# Patient Record
Sex: Male | Born: 1980 | Race: White | Hispanic: No | Marital: Single | Smoking: Current every day smoker
Health system: Southern US, Community
[De-identification: ages and names within clinical notes are randomized; demographics above are authoritative.]

## PROBLEM LIST (undated history)

## (undated) DIAGNOSIS — F909 Attention-deficit hyperactivity disorder, unspecified type: Secondary | ICD-10-CM

## (undated) HISTORY — DX: Attention-deficit hyperactivity disorder, unspecified type: F90.9

---

## 2005-01-11 ENCOUNTER — Emergency Department: Payer: Self-pay | Admitting: Emergency Medicine

## 2005-09-28 ENCOUNTER — Ambulatory Visit: Payer: Self-pay | Admitting: Family Medicine

## 2005-10-17 ENCOUNTER — Emergency Department: Payer: Self-pay | Admitting: Emergency Medicine

## 2006-03-08 ENCOUNTER — Emergency Department: Payer: Self-pay | Admitting: Unknown Physician Specialty

## 2006-08-25 ENCOUNTER — Emergency Department: Payer: Self-pay | Admitting: Emergency Medicine

## 2010-09-10 ENCOUNTER — Emergency Department: Payer: Self-pay | Admitting: Emergency Medicine

## 2011-07-17 ENCOUNTER — Emergency Department: Payer: Self-pay | Admitting: *Deleted

## 2012-08-01 ENCOUNTER — Emergency Department: Payer: Self-pay | Admitting: Emergency Medicine

## 2012-08-23 ENCOUNTER — Emergency Department: Payer: Self-pay | Admitting: Emergency Medicine

## 2013-06-10 ENCOUNTER — Emergency Department: Payer: Self-pay | Admitting: Emergency Medicine

## 2018-10-25 ENCOUNTER — Ambulatory Visit: Payer: Self-pay | Admitting: Gerontology

## 2018-10-25 ENCOUNTER — Encounter: Payer: Self-pay | Admitting: Gerontology

## 2018-10-25 ENCOUNTER — Other Ambulatory Visit: Payer: Self-pay

## 2018-10-25 VITALS — BP 124/78 | HR 94 | Ht 72.0 in | Wt 171.0 lb

## 2018-10-25 DIAGNOSIS — S70261D Insect bite (nonvenomous), right hip, subsequent encounter: Secondary | ICD-10-CM

## 2018-10-25 DIAGNOSIS — Z7689 Persons encountering health services in other specified circumstances: Secondary | ICD-10-CM | POA: Insufficient documentation

## 2018-10-25 DIAGNOSIS — W57XXXA Bitten or stung by nonvenomous insect and other nonvenomous arthropods, initial encounter: Secondary | ICD-10-CM | POA: Insufficient documentation

## 2018-10-25 DIAGNOSIS — W57XXXD Bitten or stung by nonvenomous insect and other nonvenomous arthropods, subsequent encounter: Secondary | ICD-10-CM

## 2018-10-25 NOTE — Progress Notes (Signed)
Patient ID: Tyler Stanley, male   DOB: 02/22/1981, 38 y.o.   MRN: [id number]  Chief Complaint  Patient presents with  . New Patient (Initial Visit)    bug bites on back/butt, infected    HPI Tyler Stanley is a 38 y.o. male who presents to establish care and follow up for skin lesion to right upper gluteal area secondary to insect bite. He was evaluated on 10/20/18 at the [facility] for insect bite to right upper gluteal area, and he continues to take 500 mg Keflex qid and applying mupirocin antibiotic ointment to site. He reports that the bite site is improving, he denies fever, chills and itching to site. 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   He also reports that he had removed many ticks from his body, and he denies fever, headaches,fatigue and athralgia. He smokes 1/2 pack of cigarette daily and admits the desire to quit. He denies chest pain, palpitation, chills and no further concerns.   Past Medical History:  Diagnosis Date  . ADHD       No family history on file.  Social History Social History   Tobacco Use  . Smoking status: Current Every Day Smoker    Packs/day: 0.50    Types: Cigarettes  . Smokeless tobacco: Never Used  Substance Use Topics  . Alcohol use: Not Currently  . Drug use: Not Currently    No Known Allergies  Current Outpatient Medications  Medication Sig Dispense Refill  . cephALEXin (KEFLEX) 500 MG capsule Take 500 mg by mouth 4 (four) times daily.    . mupirocin ointment (BACTROBAN) 2 % Place 1 application into the nose 2 (two) times daily.     No current facility-administered medications for this visit.     Review of Systems Review of Systems  Constitutional: Negative.   HENT: Negative.   Eyes: Negative.   Respiratory: Negative.   Cardiovascular: Negative.   Gastrointestinal: Negative.   Genitourinary: Negative.   Musculoskeletal: Negative.   Skin: Negative.  Negative for rash and wound.       Insect  bite to right right upper gluteal area  Neurological: Negative.   Hematological: Negative.   Psychiatric/Behavioral: Negative.     Blood pressure 124/78, pulse 94, height 6' (1.829 m), weight 171 lb (77.6 kg), SpO2 98 %.  Physical Exam Physical Exam HENT:     Head: Normocephalic and atraumatic.  Eyes:     Extraocular Movements: Extraocular movements intact.     Pupils: Pupils are equal, round, and reactive to light.  Neck:     Musculoskeletal: Normal range of motion.  Cardiovascular:     Rate and Rhythm: Normal rate and regular rhythm.     Pulses: Normal pulses.     Heart sounds: Normal heart sounds.  Pulmonary:     Effort: Pulmonary effort is normal.     Breath sounds: Normal breath sounds.  Abdominal:     General: Abdomen is flat. Bowel sounds are normal.     Palpations: Abdomen is soft.  Musculoskeletal: Normal range of motion.  Skin:    General: Skin is warm and dry.     Findings: Erythema and lesion present.       Neurological:     General: No focal deficit present.     Mental Status: He is alert and oriented to person, place, and time. Mental status is at baseline.  Psychiatric:  Mood and Affect: Mood normal.        Behavior: Behavior normal.        Thought Content: Thought content normal.        Judgment: Judgment normal.     Data Reviewed Routine labs will be collected.  Assessment and Plan 1. Encounter to establish care - Routine labs will be collected. - CBC w/Diff; Future - Comp Met (CMET); Future - Urinalysis; Future - Lipid panel; Future - TSH; Future  2. Insect bite of right hip, subsequent encounter - He was encouraged to finish antibiotics and to notify clinic for worsening symptoms. - CBC w/Diff; Future  3. Tick bite, subsequent encounter - He was advised to notify clinic for any tick bites. - Lyme Disease, Western Blot; Future   Follow up in 3 months 01/25/2019 or if symptoms worsens.  Tyler Stanley 10/25/2018, 9:53 PM

## 2018-11-16 ENCOUNTER — Other Ambulatory Visit: Payer: Self-pay | Admitting: Gerontology

## 2018-11-16 DIAGNOSIS — K0500 Acute gingivitis, plaque induced: Secondary | ICD-10-CM

## 2018-11-16 MED ORDER — CHLORHEXIDINE GLUCONATE 0.12 % MT SOLN: 15.0000 mL | Freq: Two times a day (BID) | OROMUCOSAL | 0 refills | Status: DC

## 2018-11-16 MED ORDER — METRONIDAZOLE 250 MG PO TABS: 250.0000 mg | ORAL_TABLET | Freq: Three times a day (TID) | ORAL | 0 refills | Status: DC

## 2018-11-23 ENCOUNTER — Other Ambulatory Visit: Payer: Self-pay

## 2018-11-23 ENCOUNTER — Ambulatory Visit: Payer: Self-pay | Admitting: Pharmacy Technician

## 2018-11-23 DIAGNOSIS — Z79899 Other long term (current) drug therapy: Secondary | ICD-10-CM

## 2018-11-23 NOTE — Progress Notes (Signed)
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  Also, provide 2019 tax return.  [facility]

## 2019-01-18 ENCOUNTER — Other Ambulatory Visit: Payer: Self-pay

## 2019-01-24 ENCOUNTER — Encounter: Payer: Self-pay | Admitting: Gerontology

## 2019-01-24 ENCOUNTER — Ambulatory Visit: Payer: Self-pay | Admitting: Gerontology

## 2019-01-24 ENCOUNTER — Other Ambulatory Visit: Payer: Self-pay

## 2019-01-24 DIAGNOSIS — I10 Essential (primary) hypertension: Secondary | ICD-10-CM | POA: Insufficient documentation

## 2019-01-24 DIAGNOSIS — F17200 Nicotine dependence, unspecified, uncomplicated: Secondary | ICD-10-CM | POA: Insufficient documentation

## 2019-01-24 DIAGNOSIS — IMO0001 Reserved for inherently not codable concepts without codable children: Secondary | ICD-10-CM | POA: Insufficient documentation

## 2019-01-24 NOTE — Progress Notes (Signed)
Established Patient Office Visit  Subjective:  Patient ID: Tyler Stanley, male    DOB: [birth year]  Age: 38 y.o. MRN: [id number]  CC: No chief complaint on file.   HPI Tyler Stanley presents for 3 months follow up. He reports that the skin lesionsto to his right upper gluteal area secondary to insect bite has resolved. He states that he forgot to follow up with his lab draw. His blood pressure during visit was elevated, he denies chest pain, palpitation, light headedness, and headache. He continues to smoke 1 pack of cigarette daily and admits the desire to quit. He states that he will make some lifestyle modifications, he reports that he's doing well and denies no further concerns.  Past Medical History:  Diagnosis Date  . ADHD       No family history on file.  Social History   Socioeconomic History  . Marital status: Single    Spouse name: Not on file  . Number of children: Not on file  . Years of education: Not on file  . Highest education level: Not on file  Occupational History  . Not on file  Social Needs  . Financial resource strain: Not on file  . Food insecurity    Worry: Not on file    Inability: Not on file  . Transportation needs    Medical: Not on file    Non-medical: Not on file  Tobacco Use  . Smoking status: Current Every Day Smoker    Packs/day: 0.50    Types: Cigarettes  . Smokeless tobacco: Never Used  Substance and Sexual Activity  . Alcohol use: Not Currently  . Drug use: Not Currently  . Sexual activity: Not on file  Lifestyle  . Physical activity    Days per week: Not on file    Minutes per session: Not on file  . Stress: Not on file  Relationships  . Social Herbalist on phone: Not on file    Gets together: Not on file    Attends religious service: Not on file    Active member of club or organization: Not on file    Attends meetings of clubs or organizations: Not on file    Relationship status: Not on file  . Intimate  partner violence    Fear of current or ex partner: Not on file    Emotionally abused: Not on file    Physically abused: Not on file    Forced sexual activity: Not on file  Other Topics Concern  . Not on file  Social History Narrative  . Not on file    Outpatient Medications Prior to Visit  Medication Sig Dispense Refill  . cephALEXin (KEFLEX) 500 MG capsule Take 500 mg by mouth 4 (four) times daily.    . chlorhexidine (PERIDEX) 0.12 % solution Use as directed 15 mLs in the mouth or throat 2 (two) times daily. (Patient not taking: Reported on 01/24/2019) 120 mL 0  . metroNIDAZOLE (FLAGYL) 250 MG tablet Take 1 tablet (250 mg total) by mouth 3 (three) times daily. (Patient not taking: Reported on 01/24/2019) 21 tablet 0  . mupirocin ointment (BACTROBAN) 2 % Place 1 application into the nose 2 (two) times daily.     No facility-administered medications prior to visit.     No Known Allergies  ROS Review of Systems  Constitutional: Negative.   HENT: Negative.   Eyes: Negative.   Respiratory: Negative.   Cardiovascular: Negative.  Genitourinary: Negative.   Skin: Negative.   Neurological: Negative.   Psychiatric/Behavioral: Negative.       Objective:    Physical Exam  Constitutional: He is oriented to person, place, and time. He appears well-developed and well-nourished.  HENT:  Head: Normocephalic.  Eyes: Pupils are equal, round, and reactive to light. EOM are normal.  Cardiovascular: Normal rate and regular rhythm.  Pulmonary/Chest: Effort normal.  Abdominal: Soft. Bowel sounds are normal.  Neurological: He is alert and oriented to person, place, and time.  Skin: Skin is warm and dry.  Psychiatric: He has a normal mood and affect. His behavior is normal. Judgment and thought content normal.    BP (!) 141/99 (BP Location: Left Arm, Patient Position: Sitting, Cuff Size: Normal)   Pulse 91   Ht 6' (1.829 m)   Wt 159 lb (72.1 kg)   SpO2 100%   BMI 21.56 kg/m  Wt  Readings from Last 3 Encounters:  01/24/19 159 lb (72.1 kg)  10/25/18 171 lb (77.6 kg)     Health Maintenance Due  Topic Date Due  . HIV Screening  03/19/1995  . TETANUS/TDAP  03/19/1999    There are no preventive care reminders to display for this patient.  No results found for: TSH No results found for: WBC, HGB, HCT, MCV, PLT No results found for: NA, K, CHLORIDE, CO2, GLUCOSE, BUN, CREATININE, BILITOT, ALKPHOS, AST, ALT, PROT, ALBUMIN, CALCIUM, ANIONGAP, EGFR, GFR No results found for: CHOL No results found for: HDL No results found for: LDLCALC No results found for: TRIG No results found for: CHOLHDL No results found for: HGBA1C    Assessment & Plan:     1. Smoking - He was strongly advised on smoking cessation, he was provided with [facility] Quit line.  2. Essential hypertension - His blood pressure was 141/99, his goal BP is < 140/90. He was advised to check his blood pressure at home, record and bring back to clinic. - He was advised to continue on Low salt DASH diet -Exercise regularly as tolerated - He will follow up with his Lab draw.    Follow-up: Return in about 30 days (around 02/23/2019), or if symptoms worsen or fail to improve.    Breannah Kratt Jerold Coombe, NP

## 2019-01-24 NOTE — Patient Instructions (Signed)

## 2019-02-01 ENCOUNTER — Other Ambulatory Visit: Payer: Self-pay

## 2019-02-01 DIAGNOSIS — W57XXXD Bitten or stung by nonvenomous insect and other nonvenomous arthropods, subsequent encounter: Secondary | ICD-10-CM

## 2019-02-01 DIAGNOSIS — S70261D Insect bite (nonvenomous), right hip, subsequent encounter: Secondary | ICD-10-CM

## 2019-02-01 DIAGNOSIS — Z7689 Persons encountering health services in other specified circumstances: Secondary | ICD-10-CM

## 2019-02-09 LAB — COMPREHENSIVE METABOLIC PANEL
ALT: 10 IU/L (ref 0–44)
AST: 15 IU/L (ref 0–40)
Albumin/Globulin Ratio: 1.8 (ref 1.2–2.2)
Albumin: 4.4 g/dL (ref 4.0–5.0)
Alkaline Phosphatase: 74 IU/L (ref 39–117)
BUN/Creatinine Ratio: 15 (ref 9–20)
BUN: 13 mg/dL (ref 6–20)
Bilirubin Total: 0.4 mg/dL (ref 0.0–1.2)
CO2: 27 mmol/L (ref 20–29)
Calcium: 9.5 mg/dL (ref 8.7–10.2)
Chloride: 101 mmol/L (ref 96–106)
Creatinine, Ser: 0.85 mg/dL (ref 0.76–1.27)
GFR calc Af Amer: 128 mL/min/{1.73_m2} (ref >59)
GFR calc non Af Amer: 111 mL/min/{1.73_m2} (ref >59)
Globulin, Total: 2.5 g/dL (ref 1.5–4.5)
Glucose: 148 mg/dL — ABNORMAL HIGH (ref 65–99)
Potassium: 4.8 mmol/L (ref 3.5–5.2)
Sodium: 140 mmol/L (ref 134–144)
Total Protein: 6.9 g/dL (ref 6.0–8.5)

## 2019-02-09 LAB — CBC WITH DIFFERENTIAL/PLATELET
Basophils Absolute: 0 10*3/uL (ref 0.0–0.2)
Basos: 1 %
EOS (ABSOLUTE): 0.1 10*3/uL (ref 0.0–0.4)
Eos: 2 %
Hematocrit: 46.2 % (ref 37.5–51.0)
Hemoglobin: 15.6 g/dL (ref 13.0–17.7)
Immature Grans (Abs): 0 10*3/uL (ref 0.0–0.1)
Immature Granulocytes: 0 %
Lymphocytes Absolute: 2.1 10*3/uL (ref 0.7–3.1)
Lymphs: 40 %
MCH: 30.6 pg (ref 26.6–33.0)
MCHC: 33.8 g/dL (ref 31.5–35.7)
MCV: 91 fL (ref 79–97)
Monocytes Absolute: 0.4 10*3/uL (ref 0.1–0.9)
Monocytes: 7 %
Neutrophils Absolute: 2.7 10*3/uL (ref 1.4–7.0)
Neutrophils: 50 %
Platelets: 208 10*3/uL (ref 150–450)
RBC: 5.1 x10E6/uL (ref 4.14–5.80)
RDW: 12.6 % (ref 11.6–15.4)
WBC: 5.4 10*3/uL (ref 3.4–10.8)

## 2019-02-09 LAB — LYME DISEASE, WESTERN BLOT
IgG P18 Ab.: ABSENT
IgG P28 Ab.: ABSENT
IgG P30 Ab.: ABSENT
IgG P39 Ab.: ABSENT
IgG P41 Ab.: ABSENT
IgG P45 Ab.: ABSENT
IgG P58 Ab.: ABSENT
IgG P66 Ab.: ABSENT
IgG P93 Ab.: ABSENT
IgM P39 Ab.: ABSENT
IgM P41 Ab.: ABSENT
Lyme IgG Wb: NEGATIVE
Lyme IgM Wb: NEGATIVE

## 2019-02-09 LAB — LIPID PANEL
Chol/HDL Ratio: 2.9 ratio (ref 0.0–5.0)
Cholesterol, Total: 199 mg/dL (ref 100–199)
HDL: 68 mg/dL (ref >39)
LDL Chol Calc (NIH): 117 mg/dL — ABNORMAL HIGH (ref 0–99)
Triglycerides: 78 mg/dL (ref 0–149)
VLDL Cholesterol Cal: 14 mg/dL (ref 5–40)

## 2019-02-09 LAB — TSH: TSH: 0.78 u[IU]/mL (ref 0.450–4.500)

## 2019-02-14 ENCOUNTER — Ambulatory Visit: Payer: Self-pay | Admitting: Gerontology

## 2019-03-02 ENCOUNTER — Telehealth: Payer: Self-pay | Admitting: Pharmacy Technician

## 2019-03-02 NOTE — Telephone Encounter (Signed)
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  Patient failed to provide verification of income from farming job and 2019 tax return.  No additional medication assistance will be provided by [facility] without the required proof of income documentation.  Patient notified.  [facility]

## 2019-09-13 ENCOUNTER — Ambulatory Visit: Payer: Self-pay

## 2020-09-18 ENCOUNTER — Encounter: Payer: Self-pay | Admitting: Emergency Medicine

## 2020-09-18 ENCOUNTER — Other Ambulatory Visit: Payer: Self-pay

## 2020-09-18 ENCOUNTER — Emergency Department
Admission: EM | Admit: 2020-09-18 | Discharge: 2020-09-18 | Disposition: A | Discharge status: Home / Self Care | Payer: 59 | Attending: Emergency Medicine | Admitting: Emergency Medicine

## 2020-09-18 ENCOUNTER — Emergency Department: Payer: 59

## 2020-09-18 DIAGNOSIS — G44221 Chronic tension-type headache, intractable: Secondary | ICD-10-CM | POA: Insufficient documentation

## 2020-09-18 DIAGNOSIS — R519 Headache, unspecified: Secondary | ICD-10-CM

## 2020-09-18 DIAGNOSIS — F17210 Nicotine dependence, cigarettes, uncomplicated: Secondary | ICD-10-CM | POA: Diagnosis not present

## 2020-09-18 DIAGNOSIS — K047 Periapical abscess without sinus: Secondary | ICD-10-CM | POA: Diagnosis not present

## 2020-09-18 LAB — COMPREHENSIVE METABOLIC PANEL
ALT: 20 U/L (ref 0–44)
AST: 23 U/L (ref 15–41)
Albumin: 4.2 g/dL (ref 3.5–5.0)
Alkaline Phosphatase: 66 U/L (ref 38–126)
Anion gap: 10 (ref 5–15)
BUN: 11 mg/dL (ref 6–20)
CO2: 26 mmol/L (ref 22–32)
Calcium: 9.6 mg/dL (ref 8.9–10.3)
Chloride: 100 mmol/L (ref 98–111)
Creatinine, Ser: 0.84 mg/dL (ref 0.61–1.24)
GFR, Estimated: >60 mL/min (ref >60)
Glucose, Bld: 91 mg/dL (ref 70–99)
Potassium: 4 mmol/L (ref 3.5–5.1)
Sodium: 136 mmol/L (ref 135–145)
Total Bilirubin: 0.6 mg/dL (ref 0.3–1.2)
Total Protein: 7.1 g/dL (ref 6.5–8.1)

## 2020-09-18 LAB — CBC
HCT: 47.7 % (ref 39.0–52.0)
Hemoglobin: 16.1 g/dL (ref 13.0–17.0)
MCH: 31 pg (ref 26.0–34.0)
MCHC: 33.8 g/dL (ref 30.0–36.0)
MCV: 91.9 fL (ref 80.0–100.0)
Platelets: 184 10*3/uL (ref 150–400)
RBC: 5.19 MIL/uL (ref 4.22–5.81)
RDW: 12.2 % (ref 11.5–15.5)
WBC: 8.6 10*3/uL (ref 4.0–10.5)
nRBC: 0 % (ref 0.0–0.2)

## 2020-09-18 LAB — SEDIMENTATION RATE: Sed Rate: 2 mm/hr (ref 0–15)

## 2020-09-18 MED ORDER — IBUPROFEN 800 MG PO TABS
800.0000 mg | ORAL_TABLET | Freq: Three times a day (TID) | ORAL | 0 refills | Status: DC | PRN
  Filled 2020-09-18: qty 20, 7d supply, fill #0

## 2020-09-18 MED ORDER — AMOXICILLIN-POT CLAVULANATE 875-125 MG PO TABS: 1.0000 | ORAL_TABLET | Freq: Two times a day (BID) | ORAL | 0 refills | Status: AC

## 2020-09-18 MED ORDER — IBUPROFEN 800 MG PO TABS: 800.0000 mg | ORAL_TABLET | Freq: Three times a day (TID) | ORAL | 0 refills | Status: DC | PRN

## 2020-09-18 MED ORDER — OXYCODONE-ACETAMINOPHEN 5-325 MG PO TABS
1.0000 | ORAL_TABLET | Freq: Once | ORAL | Status: AC
  Administered 2020-09-18: 1 via ORAL
  Filled 2020-09-18: qty 1

## 2020-09-18 MED ORDER — IBUPROFEN 800 MG PO TABS
800.0000 mg | ORAL_TABLET | Freq: Once | ORAL | Status: AC
  Administered 2020-09-18: 800 mg via ORAL
  Filled 2020-09-18: qty 1

## 2020-09-18 MED ORDER — AMOXICILLIN-POT CLAVULANATE 875-125 MG PO TABS
1.0000 | ORAL_TABLET | Freq: Two times a day (BID) | ORAL | 0 refills | Status: DC
  Filled 2020-09-18: qty 14, 7d supply, fill #0

## 2020-09-18 NOTE — ED Provider Notes (Signed)
[facility] Emergency Department Provider Note  ____________________________________________   Event Date/Time   First MD Initiated Contact with Patient 09/18/20 1035     (approximate)  I have reviewed the triage vital signs and the nursing notes.   HISTORY  Chief Complaint Headache and Blurred Vision    HPI Tyler Stanley is a 40 y.o. male here with left sided headache and tooth pain.  The patient states that for the last 3 to 4 days, he has had an aching, throbbing, left upper tooth ache and pain along the left temple and behind his eye.  It is an aching, throbbing pain that is intermittently sharp and stabbing.  He states that it is worse when he chews food and occasionally grinds his teeth on the left.  It shoots up from his tooth to the area of the temple.  He states that when the pain is severe he has felt like his vision may have been blurry but this is not persisted.  No floaters.  No history of prior migraines.  No history of eye issues.  Of note, he does state that his tooth fractured several days ago prior to the onset of pain.  No fevers or chills.  No neck pain or stiffness.  No photophobia or phonophobia.        Past Medical History:  Diagnosis Date  . ADHD     Patient Active Problem List   Diagnosis Date Noted  . Smoking 01/24/2019  . Essential hypertension 01/24/2019  . Encounter to establish care 10/25/2018  . Insect bite 10/25/2018  . Tick bites 10/25/2018    History reviewed. No pertinent surgical history.  Prior to Admission medications   Medication Sig Start Date End Date Taking? Authorizing Provider  amoxicillin-clavulanate (AUGMENTIN) 875-125 MG tablet Take 1 tablet by mouth 2 (two) times daily for 7 days. 09/18/20 09/25/20 Yes Shaune Pollack, MD  ibuprofen (ADVIL) 600 MG tablet Take 1 tablet (600 mg total) by mouth every 8 (eight) hours as needed for moderate pain. 09/18/20  Yes Shaune Pollack, MD    Allergies Patient has  no known allergies.  History reviewed. No pertinent family history.  Social History Social History   Tobacco Use  . Smoking status: Current Every Day Smoker    Packs/day: 0.50    Types: Cigarettes  . Smokeless tobacco: Never Used  Vaping Use  . Vaping Use: Never used  Substance Use Topics  . Alcohol use: Not Currently  . Drug use: Not Currently    Review of Systems  Review of Systems  Constitutional: Positive for fatigue. Negative for chills and fever.  HENT: Positive for dental problem and sinus pressure. Negative for sore throat.   Respiratory: Negative for shortness of breath.   Cardiovascular: Negative for chest pain.  Gastrointestinal: Negative for abdominal pain.  Genitourinary: Negative for flank pain.  Musculoskeletal: Negative for neck pain.  Skin: Negative for rash and wound.  Allergic/Immunologic: Negative for immunocompromised state.  Neurological: Positive for headaches. Negative for weakness and numbness.  Hematological: Does not bruise/bleed easily.  All other systems reviewed and are negative.    ____________________________________________  PHYSICAL EXAM:      VITAL SIGNS: ED Triage Vitals  Enc Vitals Group     BP 09/18/20 0942 (!) 144/110     Pulse Rate 09/18/20 0942 (!) 113     Resp 09/18/20 0942 20     Temp 09/18/20 0942 97.6 F (36.4 C)     Temp src --  SpO2 09/18/20 0942 100 %     Weight 09/18/20 0941 158 lb (71.7 kg)     Height 09/18/20 0941 6' (1.829 m)     Head Circumference --      Peak Flow --      Pain Score 09/18/20 0941 9     Pain Loc --      Pain Edu? --      Excl. in GC? --      Physical Exam Vitals and nursing note reviewed.  Constitutional:      General: He is not in acute distress.    Appearance: He is well-developed.  HENT:     Head: Normocephalic and atraumatic.     Comments: Markedly poor dentition, with open cavity and fractured tooth to the left upper premolar.  No appreciable gingival or dental abscess.   No facial swelling or erythema.  Extraocular movements intact without pain.  No proptosis. Eyes:     Conjunctiva/sclera: Conjunctivae normal.     Comments: No conjunctival injection.  Pupils are equal and symmetric.  Extraocular movements full and painless.  No proptosis.  Cardiovascular:     Rate and Rhythm: Normal rate and regular rhythm.     Heart sounds: Normal heart sounds. No murmur heard. No friction rub.  Pulmonary:     Effort: Pulmonary effort is normal. No respiratory distress.     Breath sounds: Normal breath sounds. No wheezing or rales.  Abdominal:     General: There is no distension.     Palpations: Abdomen is soft.     Tenderness: There is no abdominal tenderness.  Musculoskeletal:     Cervical back: Neck supple.  Skin:    General: Skin is warm.     Capillary Refill: Capillary refill takes less than 2 seconds.  Neurological:     Mental Status: He is alert and oriented to person, place, and time.     Motor: No abnormal muscle tone.       ____________________________________________   LABS (all labs ordered are listed, but only abnormal results are displayed)  Labs Reviewed  SEDIMENTATION RATE  CBC  COMPREHENSIVE METABOLIC PANEL    ____________________________________________  EKG:  ________________________________________  RADIOLOGY All imaging, including plain films, CT scans, and ultrasounds, independently reviewed by me, and interpretations confirmed via formal radiology reads.  ED MD interpretation:   CT head: No acute abnormality  Official radiology report(s): CT Head Wo Contrast  Result Date: 09/18/2020 CLINICAL DATA:  Headache. EXAM: CT HEAD WITHOUT CONTRAST TECHNIQUE: Contiguous axial images were obtained from the base of the skull through the vertex without intravenous contrast. COMPARISON:  July 17, 2011 FINDINGS: Brain: No evidence of acute infarction, hemorrhage, hydrocephalus, extra-axial collection or mass lesion/mass effect. Vascular: No  hyperdense vessel or unexpected calcification. Skull: Normal. Negative for fracture or focal lesion. Sinuses/Orbits: No acute finding. Other: None. IMPRESSION: No acute intracranial abnormality. Electronically Signed   By: Ted Mcalpine M.D.   On: 09/18/2020 10:27    ____________________________________________  PROCEDURES   Procedure(s) performed (including Critical Care):  Procedures  ____________________________________________  INITIAL IMPRESSION / MDM / ASSESSMENT AND PLAN / ED COURSE  As part of my medical decision making, I reviewed the following data within the electronic MEDICAL RECORD NUMBER Nursing notes reviewed and incorporated, Old chart reviewed, Notes from prior ED visits, and [place] Controlled Substance Database       *MCLAIN FREER was evaluated in Emergency Department on 09/18/2020 for the symptoms described in the history of present  illness. He was evaluated in the context of the global COVID-19 pandemic, which necessitated consideration that the patient might be at risk for infection with the SARS-CoV-2 virus that causes COVID-19. Institutional protocols and algorithms that pertain to the evaluation of patients at risk for COVID-19 are in a state of rapid change based on information released by regulatory bodies including the CDC and federal and state organizations. These policies and algorithms were followed during the patient's care in the ED.  Some ED evaluations and interventions may be delayed as a result of limited staffing during the pandemic.*     Medical Decision Making: 40 year old male here with left upper tooth pain and headache.  Clinically, exam is consistent with suspected dental pain with referred pain to the temple area.  He has a fractured, infected tooth without apparent abscess.  No fever, chills, photophobia, phonophobia, or signs of systemic illness or evidence to suggest meningitis or encephalitis.  His vision is intact and he has no persistent vision  changes, no conjunctival injection, no proptosis, no pain with eye movements, or other evidence to suggest preseptal or preseptal cellulitis, glaucoma, or other intraocular abnormality.  CT head obtained in triage, reviewed by me and is unremarkable.  CBC and CMP unremarkable without leukocytosis or electrolyte abnormality.  Sed rate normal, pain and exam is not concerning for temporal arteritis.  Patient will be placed on empiric antibiotics, anti-inflammatories, and discharged home.  ____________________________________________  FINAL CLINICAL IMPRESSION(S) / ED DIAGNOSES  Final diagnoses:  Dental infection  Left temporal headache     MEDICATIONS GIVEN DURING THIS VISIT:  Medications  ibuprofen (ADVIL) tablet 800 mg (has no administration in time range)  oxyCODONE-acetaminophen (PERCOCET/ROXICET) 5-325 MG per tablet 1 tablet (has no administration in time range)     ED Discharge Orders         Ordered    ibuprofen (ADVIL) 600 MG tablet  Every 8 hours PRN        09/18/20 1054    amoxicillin-clavulanate (AUGMENTIN) 875-125 MG tablet  2 times daily        09/18/20 1054           Note:  This document was prepared using Dragon voice recognition software and may include unintentional dictation errors.   Shaune Pollack, MD 09/18/20 1054

## 2020-09-18 NOTE — ED Notes (Signed)
Pt reports pressure behind eye similar to a previous sinus infection. Denies any blurred vision at this time, denies n/v. Reports pressure headache on right side of head and behind the eye

## 2020-09-18 NOTE — ED Triage Notes (Signed)
Pt to ED via POV from [facility] with c/o HA x 3 days behind R eye, pt states constant dull pain behind R eye, with intermittent sharp pain, pt also states pain to R temple. Pt states blurry vision happens with sharp pains. PT A&O x4, ambulatory without difficulty.

## 2021-08-20 DIAGNOSIS — M79644 Pain in right finger(s): Secondary | ICD-10-CM | POA: Diagnosis not present

## 2021-08-20 DIAGNOSIS — M7989 Other specified soft tissue disorders: Secondary | ICD-10-CM | POA: Diagnosis not present

## 2022-02-25 IMAGING — CT CT HEAD W/O CM
3 series · 16 of 47 positions shown, 19 images · non-contrast
Comparison: July 17, 2011

CLINICAL DATA: Headache.

EXAM:
CT HEAD WITHOUT CONTRAST
TECHNIQUE: Contiguous axial images were obtained from the base of the skull
through the vertex without intravenous contrast.

[Series 2: head wo · axial · 0.41mm/px · z∈[-149,-24]mm · 10 of 30 slices shown, 13 images]
[im 3/30  brain]
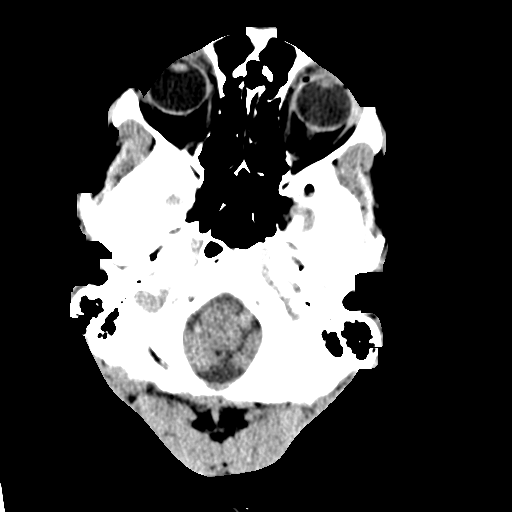
[im 3/30  bone]
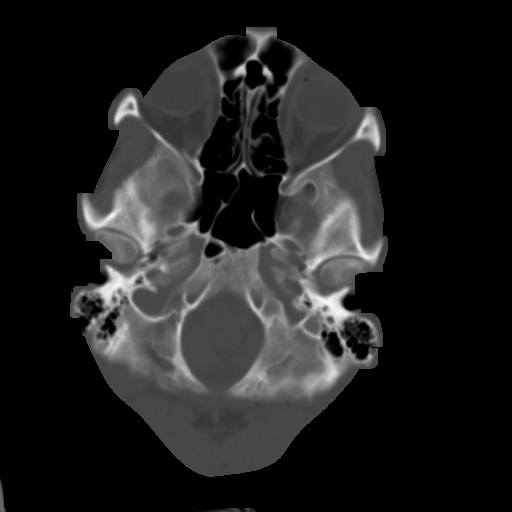
[im 6/30  brain]
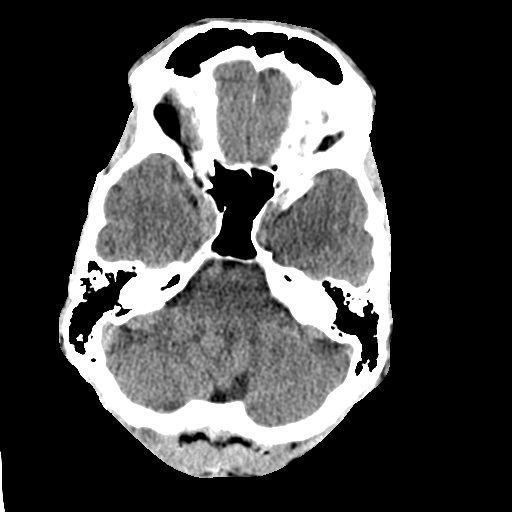
[im 9/30  brain]
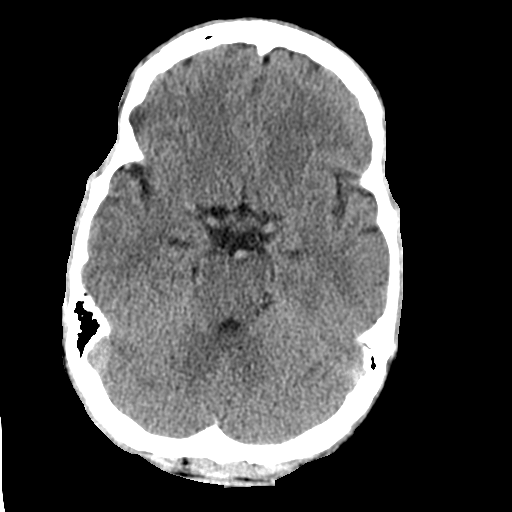
[im 11/30  brain]
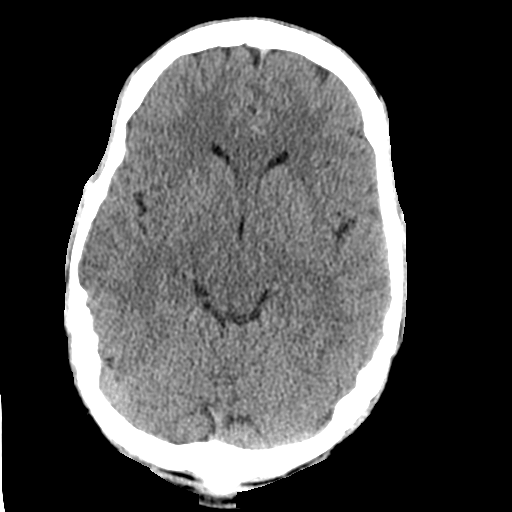
[im 14/30  brain]
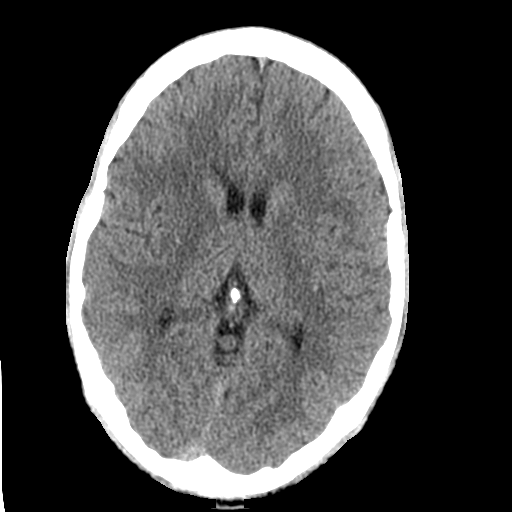
[im 14/30  bone]
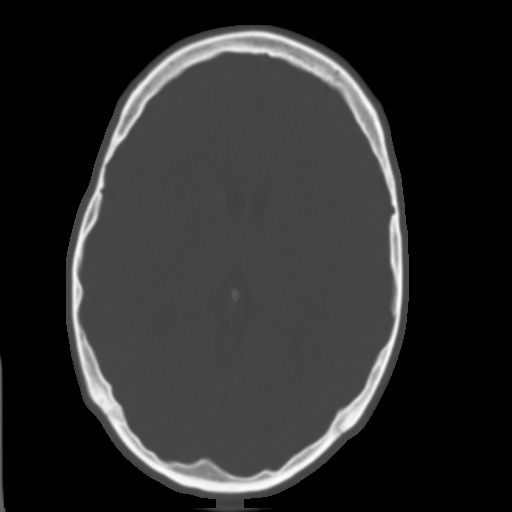
[im 17/30  brain]
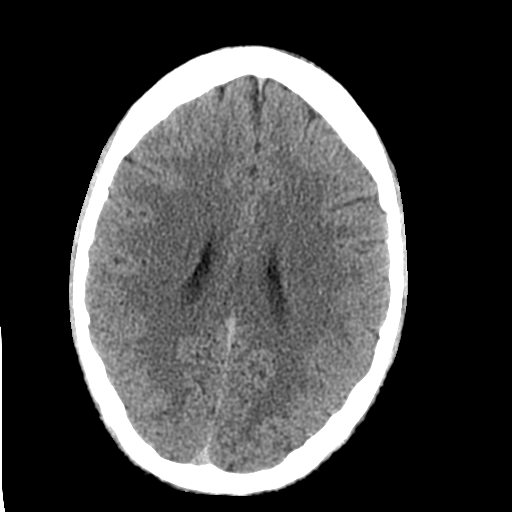
[im 20/30  brain]
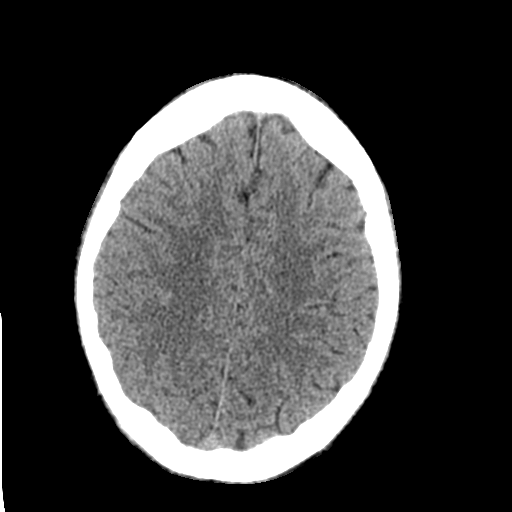
[im 23/30  brain]
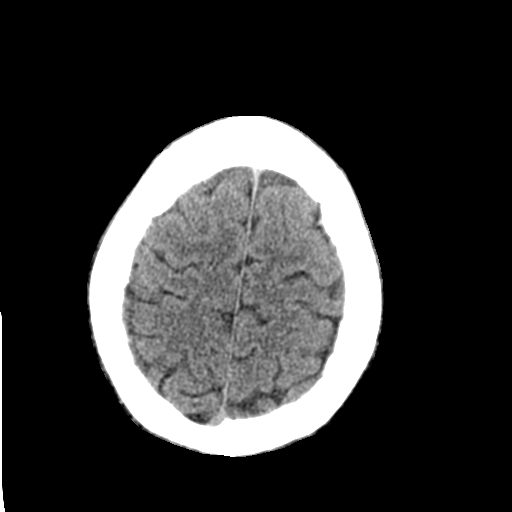
[im 25/30  brain]
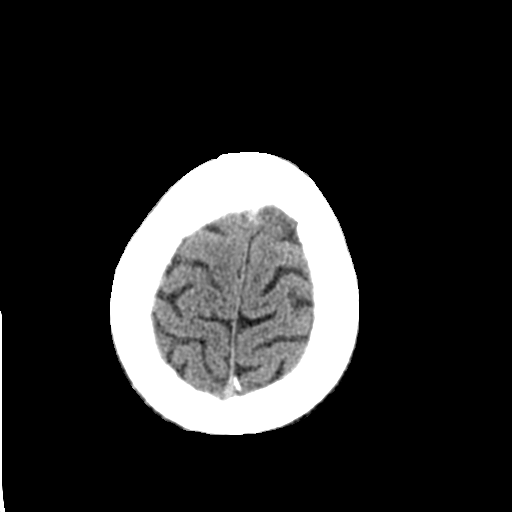
[im 25/30  bone]
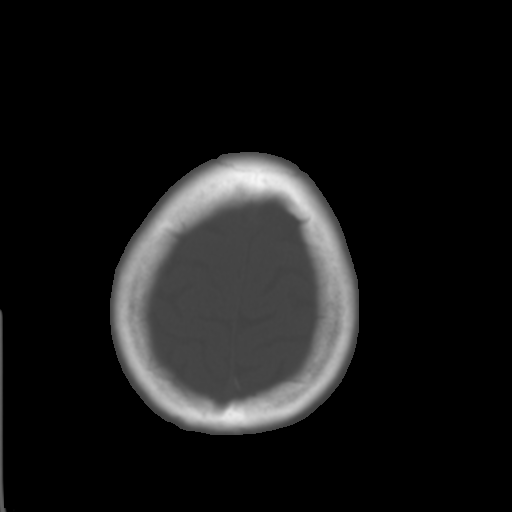
[im 28/30  brain]
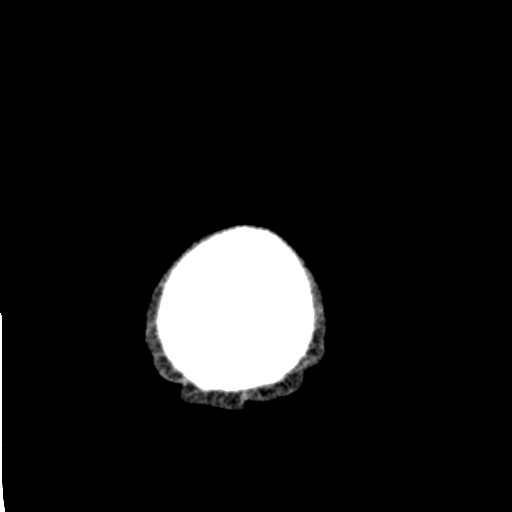

[Series 4: coronal soft tissue · coronal · 0.30mm/px · 3 of 69 slices shown]
[im 23/69  brain]
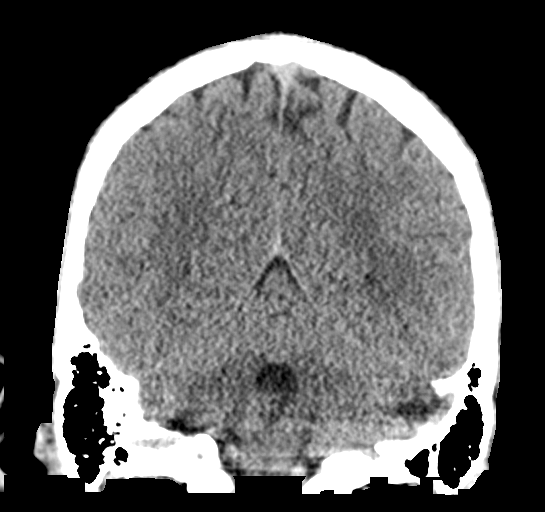
[im 31/69  brain]
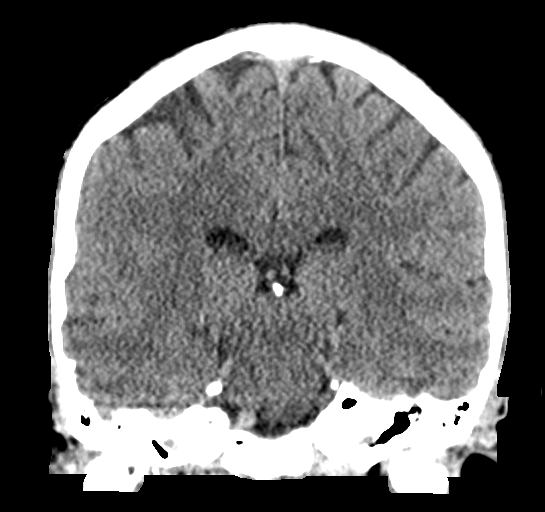
[im 38/69  brain]
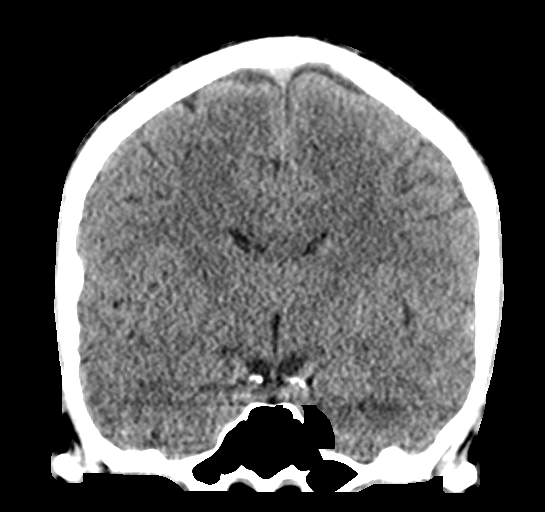

[Series 5: sagittal soft tissue · sagittal · 0.31mm/px · 3 of 56 slices shown]
[im 19/56  brain]
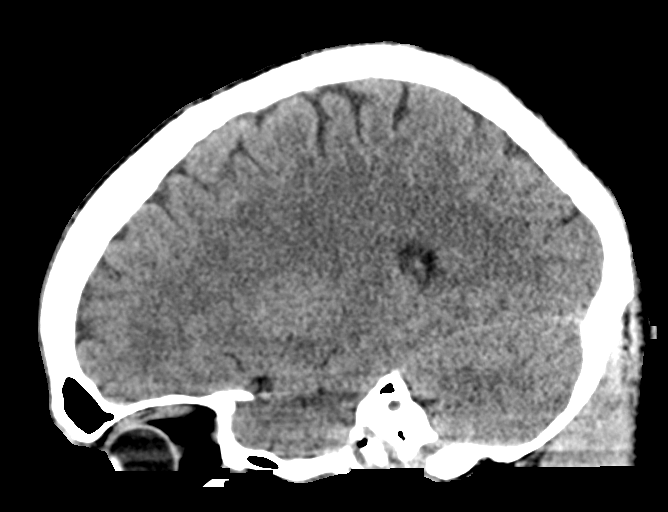
[im 28/56  brain]
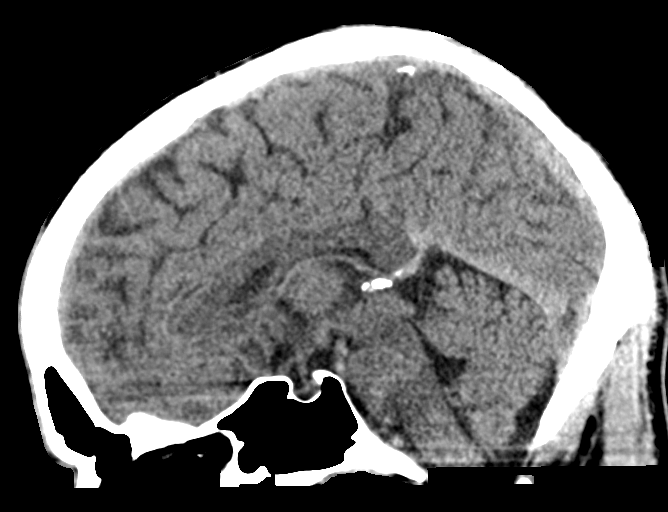
[im 37/56  brain]
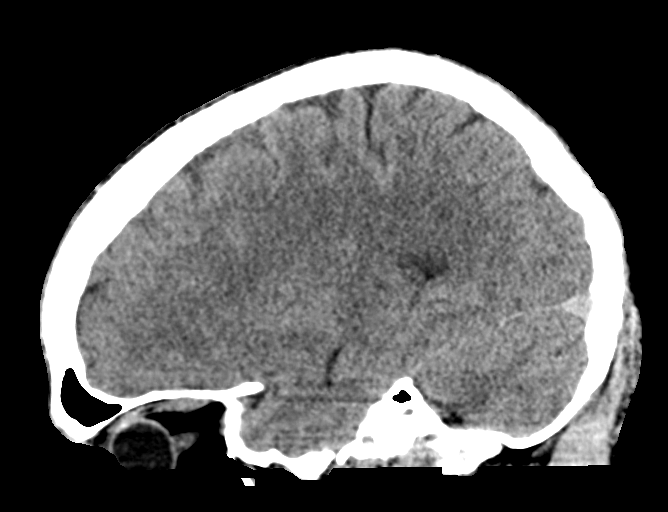

[16 of 47 positions shown; findings below may reference images not displayed]

FINDINGS: Brain: No evidence of acute infarction, hemorrhage, hydrocephalus,
extra-axial collection or mass lesion/mass effect.

Vascular: No hyperdense vessel or unexpected calcification.

Skull: Normal. Negative for fracture or focal lesion.

Sinuses/Orbits: No acute finding.

Other: None.
IMPRESSION: No acute intracranial abnormality.

## 2023-05-31 DIAGNOSIS — R1310 Dysphagia, unspecified: Secondary | ICD-10-CM | POA: Diagnosis not present

## 2023-05-31 DIAGNOSIS — K219 Gastro-esophageal reflux disease without esophagitis: Secondary | ICD-10-CM | POA: Diagnosis not present

## 2023-06-04 ENCOUNTER — Emergency Department: Payer: 59

## 2023-06-04 ENCOUNTER — Other Ambulatory Visit: Payer: Self-pay

## 2023-06-04 ENCOUNTER — Encounter: Payer: Self-pay | Admitting: Emergency Medicine

## 2023-06-04 ENCOUNTER — Inpatient Hospital Stay
Admission: EM | Admit: 2023-06-04 | Discharge: 2023-06-10 | DRG: 356 | Disposition: A | Discharge status: Home / Self Care | Payer: 59 | Attending: Internal Medicine | Admitting: Internal Medicine

## 2023-06-04 DIAGNOSIS — R634 Abnormal weight loss: Secondary | ICD-10-CM

## 2023-06-04 DIAGNOSIS — C159 Malignant neoplasm of esophagus, unspecified: Secondary | ICD-10-CM | POA: Diagnosis present

## 2023-06-04 DIAGNOSIS — R1314 Dysphagia, pharyngoesophageal phase: Secondary | ICD-10-CM | POA: Diagnosis present

## 2023-06-04 DIAGNOSIS — F05 Delirium due to known physiological condition: Secondary | ICD-10-CM | POA: Diagnosis not present

## 2023-06-04 DIAGNOSIS — R5381 Other malaise: Secondary | ICD-10-CM | POA: Diagnosis present

## 2023-06-04 DIAGNOSIS — M79662 Pain in left lower leg: Secondary | ICD-10-CM | POA: Diagnosis present

## 2023-06-04 DIAGNOSIS — R64 Cachexia: Secondary | ICD-10-CM | POA: Diagnosis present

## 2023-06-04 DIAGNOSIS — I824Z2 Acute embolism and thrombosis of unspecified deep veins of left distal lower extremity: Secondary | ICD-10-CM

## 2023-06-04 DIAGNOSIS — D689 Coagulation defect, unspecified: Secondary | ICD-10-CM | POA: Diagnosis present

## 2023-06-04 DIAGNOSIS — R1310 Dysphagia, unspecified: Secondary | ICD-10-CM

## 2023-06-04 DIAGNOSIS — I82402 Acute embolism and thrombosis of unspecified deep veins of left lower extremity: Principal | ICD-10-CM | POA: Diagnosis present

## 2023-06-04 DIAGNOSIS — Z515 Encounter for palliative care: Secondary | ICD-10-CM

## 2023-06-04 DIAGNOSIS — R000 Tachycardia, unspecified: Secondary | ICD-10-CM | POA: Diagnosis present

## 2023-06-04 DIAGNOSIS — Z716 Tobacco abuse counseling: Secondary | ICD-10-CM

## 2023-06-04 DIAGNOSIS — C155 Malignant neoplasm of lower third of esophagus: Principal | ICD-10-CM | POA: Diagnosis present

## 2023-06-04 DIAGNOSIS — I2510 Atherosclerotic heart disease of native coronary artery without angina pectoris: Secondary | ICD-10-CM | POA: Diagnosis present

## 2023-06-04 DIAGNOSIS — R112 Nausea with vomiting, unspecified: Secondary | ICD-10-CM | POA: Diagnosis present

## 2023-06-04 DIAGNOSIS — K222 Esophageal obstruction: Secondary | ICD-10-CM

## 2023-06-04 DIAGNOSIS — K219 Gastro-esophageal reflux disease without esophagitis: Secondary | ICD-10-CM | POA: Diagnosis present

## 2023-06-04 DIAGNOSIS — F1290 Cannabis use, unspecified, uncomplicated: Secondary | ICD-10-CM | POA: Diagnosis present

## 2023-06-04 DIAGNOSIS — E43 Unspecified severe protein-calorie malnutrition: Secondary | ICD-10-CM | POA: Diagnosis present

## 2023-06-04 DIAGNOSIS — F17210 Nicotine dependence, cigarettes, uncomplicated: Secondary | ICD-10-CM | POA: Diagnosis present

## 2023-06-04 DIAGNOSIS — O2230 Deep phlebothrombosis in pregnancy, unspecified trimester: Secondary | ICD-10-CM | POA: Diagnosis present

## 2023-06-04 DIAGNOSIS — K2289 Other specified disease of esophagus: Secondary | ICD-10-CM

## 2023-06-04 DIAGNOSIS — R1312 Dysphagia, oropharyngeal phase: Secondary | ICD-10-CM | POA: Diagnosis not present

## 2023-06-04 DIAGNOSIS — I82409 Acute embolism and thrombosis of unspecified deep veins of unspecified lower extremity: Secondary | ICD-10-CM | POA: Diagnosis present

## 2023-06-04 DIAGNOSIS — M7989 Other specified soft tissue disorders: Secondary | ICD-10-CM | POA: Diagnosis not present

## 2023-06-04 DIAGNOSIS — M79605 Pain in left leg: Secondary | ICD-10-CM | POA: Diagnosis not present

## 2023-06-04 DIAGNOSIS — E785 Hyperlipidemia, unspecified: Secondary | ICD-10-CM | POA: Diagnosis present

## 2023-06-04 DIAGNOSIS — C787 Secondary malignant neoplasm of liver and intrahepatic bile duct: Secondary | ICD-10-CM | POA: Diagnosis present

## 2023-06-04 DIAGNOSIS — I82412 Acute embolism and thrombosis of left femoral vein: Secondary | ICD-10-CM | POA: Diagnosis not present

## 2023-06-04 DIAGNOSIS — F909 Attention-deficit hyperactivity disorder, unspecified type: Secondary | ICD-10-CM | POA: Diagnosis present

## 2023-06-04 DIAGNOSIS — R109 Unspecified abdominal pain: Secondary | ICD-10-CM | POA: Diagnosis not present

## 2023-06-04 DIAGNOSIS — F17200 Nicotine dependence, unspecified, uncomplicated: Secondary | ICD-10-CM | POA: Diagnosis present

## 2023-06-04 DIAGNOSIS — M62838 Other muscle spasm: Secondary | ICD-10-CM | POA: Diagnosis not present

## 2023-06-04 LAB — CBC WITH DIFFERENTIAL/PLATELET
Abs Immature Granulocytes: 0.03 10*3/uL (ref 0.00–0.07)
Basophils Absolute: 0.1 10*3/uL (ref 0.0–0.1)
Basophils Relative: 1 %
Eosinophils Absolute: 0.2 10*3/uL (ref 0.0–0.5)
Eosinophils Relative: 2 %
HCT: 39.7 % (ref 39.0–52.0)
Hemoglobin: 13.2 g/dL (ref 13.0–17.0)
Immature Granulocytes: 0 %
Lymphocytes Relative: 17 %
Lymphs Abs: 1.4 10*3/uL (ref 0.7–4.0)
MCH: 29.4 pg (ref 26.0–34.0)
MCHC: 33.2 g/dL (ref 30.0–36.0)
MCV: 88.4 fL (ref 80.0–100.0)
Monocytes Absolute: 0.8 10*3/uL (ref 0.1–1.0)
Monocytes Relative: 10 %
Neutro Abs: 5.8 10*3/uL (ref 1.7–7.7)
Neutrophils Relative %: 70 %
Platelets: 241 10*3/uL (ref 150–400)
RBC: 4.49 MIL/uL (ref 4.22–5.81)
RDW: 11.9 % (ref 11.5–15.5)
WBC: 8.3 10*3/uL (ref 4.0–10.5)
nRBC: 0 % (ref 0.0–0.2)

## 2023-06-04 LAB — COMPREHENSIVE METABOLIC PANEL
ALT: 14 U/L (ref 0–44)
AST: 20 U/L (ref 15–41)
Albumin: 3 g/dL — ABNORMAL LOW (ref 3.5–5.0)
Alkaline Phosphatase: 104 U/L (ref 38–126)
Anion gap: 12 (ref 5–15)
BUN: 13 mg/dL (ref 6–20)
CO2: 25 mmol/L (ref 22–32)
Calcium: 9 mg/dL (ref 8.9–10.3)
Chloride: 98 mmol/L (ref 98–111)
Creatinine, Ser: 0.77 mg/dL (ref 0.61–1.24)
GFR, Estimated: >60 mL/min (ref >60)
Glucose, Bld: 89 mg/dL (ref 70–99)
Potassium: 4.3 mmol/L (ref 3.5–5.1)
Sodium: 135 mmol/L (ref 135–145)
Total Bilirubin: 0.3 mg/dL (ref 0.0–1.2)
Total Protein: 6.9 g/dL (ref 6.5–8.1)

## 2023-06-04 LAB — PROTIME-INR
INR: 1.1 (ref 0.8–1.2)
Prothrombin Time: 14.8 s (ref 11.4–15.2)

## 2023-06-04 LAB — APTT: aPTT: 34 s (ref 24–36)

## 2023-06-04 LAB — HEPARIN LEVEL (UNFRACTIONATED): Heparin Unfractionated: <0.1 [IU]/mL — ABNORMAL LOW (ref 0.30–0.70)

## 2023-06-04 MED ORDER — HEPARIN BOLUS VIA INFUSION
2100.0000 [IU] | Freq: Once | INTRAVENOUS | Status: AC
  Administered 2023-06-04: 2100 [IU] via INTRAVENOUS
  Filled 2023-06-04: qty 2100

## 2023-06-04 MED ORDER — ACETAMINOPHEN 325 MG PO TABS: 650.0000 mg | ORAL_TABLET | Freq: Four times a day (QID) | ORAL | Status: DC | PRN

## 2023-06-04 MED ORDER — PANTOPRAZOLE SODIUM 40 MG PO TBEC
40.0000 mg | DELAYED_RELEASE_TABLET | Freq: Every day | ORAL | Status: DC
  Administered 2023-06-04 – 2023-06-05 (×2): 40 mg via ORAL
  Filled 2023-06-04 (×2): qty 1

## 2023-06-04 MED ORDER — HEPARIN BOLUS VIA INFUSION
5000.0000 [IU] | Freq: Once | INTRAVENOUS | Status: AC
  Administered 2023-06-04: 5000 [IU] via INTRAVENOUS
  Filled 2023-06-04: qty 5000

## 2023-06-04 MED ORDER — OXYCODONE HCL 5 MG PO TABS
5.0000 mg | ORAL_TABLET | ORAL | Status: DC | PRN
  Administered 2023-06-04 – 2023-06-08 (×14): 5 mg via ORAL
  Filled 2023-06-04 (×14): qty 1

## 2023-06-04 MED ORDER — ONDANSETRON HCL 4 MG/2ML IJ SOLN
4.0000 mg | Freq: Four times a day (QID) | INTRAMUSCULAR | Status: DC | PRN
  Administered 2023-06-04 – 2023-06-07 (×3): 4 mg via INTRAVENOUS
  Filled 2023-06-04 (×3): qty 2

## 2023-06-04 MED ORDER — NICOTINE 14 MG/24HR TD PT24
14.0000 mg | MEDICATED_PATCH | Freq: Every day | TRANSDERMAL | Status: DC
  Administered 2023-06-04 – 2023-06-10 (×7): 14 mg via TRANSDERMAL
  Filled 2023-06-04 (×7): qty 1

## 2023-06-04 MED ORDER — ACETAMINOPHEN 650 MG RE SUPP: 650.0000 mg | Freq: Four times a day (QID) | RECTAL | Status: DC | PRN

## 2023-06-04 MED ORDER — HEPARIN (PORCINE) 25000 UT/250ML-% IV SOLN
2000.0000 [IU]/h | INTRAVENOUS | Status: DC
  Administered 2023-06-04: 1200 [IU]/h via INTRAVENOUS
  Administered 2023-06-05: 1700 [IU]/h via INTRAVENOUS
  Administered 2023-06-06: 1850 [IU]/h via INTRAVENOUS
  Administered 2023-06-07: 2000 [IU]/h via INTRAVENOUS
  Filled 2023-06-04 (×5): qty 250

## 2023-06-04 NOTE — ED Notes (Signed)
Informed RN bed assigned 

## 2023-06-04 NOTE — ED Notes (Signed)
See triage note  Presents with pain and swelling to left calf for the past week   Pain increased with ambulation  Pain eases off with elevation

## 2023-06-04 NOTE — Consult Note (Signed)
PHARMACY - ANTICOAGULATION CONSULT NOTE  Pharmacy Consult for Heparin Indication: DVT  No Known Allergies  Patient Measurements: Height: 6' (182.9 cm) Weight: 70.3 kg (155 lb) IBW/kg (Calculated) : 77.6 Heparin Dosing Weight: 70.3 kg  Vital Signs: Temp: 97.8 F (36.6 C) (02/21 1236) Temp Source: Oral (02/21 1236) BP: 111/83 (02/21 1236) Pulse Rate: 96 (02/21 1236)  Labs: No results for input(s): "HGB", "HCT", "PLT", "APTT", "LABPROT", "INR", "HEPARINUNFRC", "HEPRLOWMOCWT", "CREATININE", "CKTOTAL", "CKMB", "TROPONINIHS" in the last 72 hours.  CrCl cannot be calculated (Patient's most recent lab result is older than the maximum 21 days allowed.).   Medical History: Past Medical History:  Diagnosis Date   ADHD     Pertinent Medications:  No history of chronic anticoagulant use PTA.  Assessment: 43 y.o. male with no significant past medical history presents emergency department complaining of left calf pain for 4 to 5 days.  Patient states there has been some swelling, no history of blood clots, feels better when it is elevated.  No known injury. Vascular to see patient. Pharmacy has been consulted to initiate and monitor continuous heparin infusion.   Goal of Therapy:  Heparin level 0.3-0.7 units/ml Monitor platelets by anticoagulation protocol: Yes   Plan:  Give 5000 units bolus x 1 Start heparin infusion at 1200 units/hr Check anti-Xa level in 6 hours and daily while on heparin Continue to monitor H&H and platelets  Jamille Yoshino A Zaviyar Rahal 06/04/2023,12:40 PM

## 2023-06-04 NOTE — Progress Notes (Addendum)
 SLP Cancellation Note  Patient Details Name: Tyler Stanley MRN: [id number] DOB: 1980-08-22   Cancelled treatment:       Reason Eval/Treat Not Completed: SLP screened, no needs identified, will sign off (chart reviewed; NSG/MD consulted re: pt and POC)  Received order for BSE. 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 Denied any sore throat no cough. Pt has reported weight loss in the last few months. He was seen by PCP and started on PPI, and referred by PCP to see GI for outpatient EGD.".  He has NOT had the EGD rec'd per his chart Procedures.   Pt is not c/o oropharyngeal phase swallowing issues(a Speech issue) but instead, GI issues.  Discussed w/ MD and NSG that maybe a DG Esophagus (barium study) would be more helpful to determine the nature of his Vomiting/Dysmotility of food post eating meals ("throwing it back up" or it gets "stuck in the middle of my chest").   MD agreed w/ above and ordered a DG Esophagus, which will also highlight any aspiration (if occurring). NSG updated and agreed stating she did not see oropharyngeal phase issues of swallowing. MD can reconsult if any further ST needs while admitted.  Also recommended he get the EDG done (as per rec'd Outpatient by his PCP).        Jerilynn Som, MS, CCC-SLP Speech Language Pathologist Rehab Services; [facility] [phone] (ascom) Berry Godsey 06/04/2023, 4:07 PM

## 2023-06-04 NOTE — H&P (Signed)
History and Physical    Tyler Stanley ZOX:[id number] DOB: [birth year] DOA: 06/04/2023  PCP: [facility] (Confirm with patient/family/NH records and if not entered, this has to be entered at [facility] point of entry) Patient coming from: Home  I have personally briefly reviewed patient's old medical records in [facility]  Chief Complaint: Left calf pain  HPI: Tyler Stanley is a 43 y.o. male with medical history significant of ADHD, scented with worsening of left calf pain.  Symptoms started 5 days ago, when patient had " twisting a muscle" during his work.  Gradually getting worse, has been taking OTC pain medications with little relief.  Denies any chest pain shortness of breath.  Patient further reported that for the last 5 to 6 weeks has been having frequent episodes of nauseous throwing up especially after eat or drink, and occasionally he also felt food stuck in the middle of the chest then followed by throwing up of undigested food, and he frequently vomited clear secretions.  Denied any sore throat no cough.  He was seen by PCP and started on PPI, and referred by PCP to see GI for outpatient EGD.  He estimated lost about 15 pounds in the last few months.  Patient was adopted when was young, and does not know his biological parents' family history.  ED Course: Afebrile, borderline tachycardia blood pressure 126/89 O2 saturation 100% on room air.  DVT study positive for acute DVT in the left calf to mid femoral vein.  Vascular surgeon consulted for possible embolectomy.  Vascular surgeon recommended heparin drip for 24 hours.  Review of Systems: As per HPI otherwise 14 point review of systems negative.    Past Medical History:  Diagnosis Date   ADHD     History reviewed. No pertinent surgical history.   reports that he has been smoking cigarettes. He has never used smokeless tobacco. He reports that he does not currently use alcohol. He reports that he does not currently  use drugs.  No Known Allergies  History reviewed. No pertinent family history.  Prior to Admission medications   Medication Sig Start Date End Date Taking? Authorizing Provider  ibuprofen (ADVIL) 800 MG tablet Take 1 tablet (800 mg total) by mouth every 8 (eight) hours as needed for moderate pain. 09/18/20   Shaune Pollack, MD    Physical Exam: Vitals:   06/04/23 0836 06/04/23 0837 06/04/23 1236 06/04/23 1425  BP: 126/89  111/83   Pulse: (!) 102  96   Resp: 16  18   Temp: (!) 97.5 F (36.4 C)  97.8 F (36.6 C)   TempSrc: Oral  Oral   SpO2: 94%  100% 100%  Weight:  70.3 kg    Height:  6' (1.829 m)      Constitutional: NAD, calm, comfortable Vitals:   06/04/23 0836 06/04/23 0837 06/04/23 1236 06/04/23 1425  BP: 126/89  111/83   Pulse: (!) 102  96   Resp: 16  18   Temp: (!) 97.5 F (36.4 C)  97.8 F (36.6 C)   TempSrc: Oral  Oral   SpO2: 94%  100% 100%  Weight:  70.3 kg    Height:  6' (1.829 m)     Eyes: PERRL, lids and conjunctivae normal ENMT: Mucous membranes are moist. Posterior pharynx clear of any exudate or lesions.Normal dentition.  Neck: normal, supple, no masses, no thyromegaly Respiratory: clear to auscultation bilaterally, no wheezing, no crackles. Normal respiratory effort. No accessory muscle use.  Cardiovascular: Regular rate and rhythm, no murmurs / rubs / gallops. No extremity edema. 2+ pedal pulses. No carotid bruits.  Abdomen: no tenderness, no masses palpated. No hepatosplenomegaly. Bowel sounds positive.  Musculoskeletal: no clubbing / cyanosis. No joint deformity upper and lower extremities. Good ROM, no contractures. Normal muscle tone. Left calf tenderness Skin: no rashes, lesions, ulcers. No induration Neurologic: CN 2-12 grossly intact. Sensation intact, DTR normal. Strength 5/5 in all 4.  Psychiatric: Normal judgment and insight. Alert and oriented x 3. Normal mood.     Labs on Admission: I have personally reviewed following labs and imaging  studies  CBC: Recent Labs  Lab 06/04/23 1220  WBC 8.3  NEUTROABS 5.8  HGB 13.2  HCT 39.7  MCV 88.4  PLT 241   Basic Metabolic Panel: Recent Labs  Lab 06/04/23 1220  NA 135  K 4.3  CL 98  CO2 25  GLUCOSE 89  BUN 13  CREATININE 0.77  CALCIUM 9.0   GFR: Estimated Creatinine Clearance: 118.4 mL/min (by C-G formula based on SCr of 0.77 mg/dL). Liver Function Tests: Recent Labs  Lab 06/04/23 1220  AST 20  ALT 14  ALKPHOS 104  BILITOT 0.3  PROT 6.9  ALBUMIN 3.0*   No results for input(s): "LIPASE", "AMYLASE" in the last 168 hours. No results for input(s): "AMMONIA" in the last 168 hours. Coagulation Profile: Recent Labs  Lab 06/04/23 1220  INR 1.1   Cardiac Enzymes: No results for input(s): "CKTOTAL", "CKMB", "CKMBINDEX", "TROPONINI" in the last 168 hours. BNP (last 3 results) No results for input(s): "PROBNP" in the last 8760 hours. HbA1C: No results for input(s): "HGBA1C" in the last 72 hours. CBG: No results for input(s): "GLUCAP" in the last 168 hours. Lipid Profile: No results for input(s): "CHOL", "HDL", "LDLCALC", "TRIG", "CHOLHDL", "LDLDIRECT" in the last 72 hours. Thyroid Function Tests: No results for input(s): "TSH", "T4TOTAL", "FREET4", "T3FREE", "THYROIDAB" in the last 72 hours. Anemia Panel: No results for input(s): "VITAMINB12", "FOLATE", "FERRITIN", "TIBC", "IRON", "RETICCTPCT" in the last 72 hours. Urine analysis: No results found for: "COLORURINE", "APPEARANCEUR", "LABSPEC", "PHURINE", "GLUCOSEU", "HGBUR", "BILIRUBINUR", "KETONESUR", "PROTEINUR", "UROBILINOGEN", "NITRITE", "LEUKOCYTESUR"  Radiological Exams on Admission: US Venous Img Lower Unilateral Left Result Date: 06/04/2023 CLINICAL DATA:  Several day history of left lower extremity pain, swelling, and warmth EXAM: LEFT LOWER EXTREMITY VENOUS DOPPLER ULTRASOUND TECHNIQUE: Gray-scale sonography with compression, as well as color and duplex ultrasound, were performed to evaluate the  deep venous system(s) from the level of the common femoral vein through the popliteal and proximal calf veins. COMPARISON:  None Available. FINDINGS: VENOUS Echogenic thrombus is present within calf veins extending to the level of mid femoral vein. The calf veins, popliteal, and distal femoral veins are noncompressible. The proximal femoral vein is patent and compressible, however there is absence of normal respiratory plasticity and response to augmentation. Normal compressibility of the common femoral vein. Visualized portions of profunda femoral vein and great saphenous vein unremarkable. Limited views of the contralateral common femoral vein are unremarkable. OTHER None. Limitations: none IMPRESSION: Acute deep venous thrombosis of the left lower extremity extending from the calf veins to the level of mid femoral vein. These results will be called to the ordering clinician or representative by the Radiologist Assistant, and communication documented in the PACS or [employer]. Electronically Signed   By: Agustin Cree M.D.   On: 06/04/2023 11:52    EKG: None  Assessment/Plan Principal Problem:   DVT (deep vein thrombosis) in pregnancy Active Problems:  Smoking   Acute deep vein thrombosis (DVT) of distal vein of left lower extremity (HCC)   Dysphagia  (please populate well all problems here in Problem List. (For example, if patient is on BP meds at home and you resume or decide to hold them, it is a problem that needs to be her. Same for CAD, COPD, HLD and so on)  Acute nonprovoked DVT -Vascular surgery reviewed the case and recommend no embolectomy.  As per recommendation from vascular surgery, will continue parenteral anticoagulation heparin drip for at least 24 hours then consider switch to Eliquis on discharge. -Patient denied any chest pain shortness of breath, heart rate BP and oxygen level stable, will hold off PE study. -Outpatient hematology follow-up for hypercoagulable state workup.   Patient agreed with the plan  Intractable nauseous vomiting with weight loss Subacute dysphagia -Consult speech evaluation -Outpatient GI and EGD  DVT prophylaxis: Heparin drip Code Status: Full code Family Communication: Stepmother at bedside Disposition Plan: Expect less than 2 midnight hospital stay Consults called: Vascular surgery Admission status: MedSurg observation   Emeline General MD [facility] Pager [phone]  06/04/2023, 2:26 PM

## 2023-06-04 NOTE — ED Triage Notes (Addendum)
Pt arrived via POV from [facility] with reports of L calf pain x 4-5 days, pt states the pain is worse with ambulation, states it feels better when it is elevated. Some swelling noted, no hx of blood clots.

## 2023-06-04 NOTE — Consult Note (Signed)
PHARMACY - ANTICOAGULATION CONSULT NOTE  Pharmacy Consult for Heparin Indication: DVT  No Known Allergies  Patient Measurements: Height: 6' (182.9 cm) Weight: 70.3 kg (155 lb) IBW/kg (Calculated) : 77.6 Heparin Dosing Weight: 70.3 kg  Vital Signs: Temp: 98 F (36.7 C) (02/21 1525) Temp Source: Oral (02/21 1525) BP: 130/85 (02/21 1525) Pulse Rate: 109 (02/21 1525)  Labs: Recent Labs    06/04/23 1220 06/04/23 1917  HGB 13.2  --   HCT 39.7  --   PLT 241  --   APTT 34  --   LABPROT 14.8  --   INR 1.1  --   HEPARINUNFRC  --  <0.10*  CREATININE 0.77  --     Estimated Creatinine Clearance: 118.4 mL/min (by C-G formula based on SCr of 0.77 mg/dL).   Medical History: Past Medical History:  Diagnosis Date   ADHD     Pertinent Medications:  No history of chronic anticoagulant use PTA.  Assessment: 43 y.o. male with no significant past medical history presents emergency department complaining of left calf pain for 4 to 5 days.  Patient states there has been some swelling, no history of blood clots, feels better when it is elevated.  No known injury. Vascular to see patient. Pharmacy has been consulted to initiate and monitor continuous heparin infusion.  Date/Time HL Rate  Comment 2/21 1917 <0.1 1200 units/hr Subtherapeutic  Goal of Therapy:  Heparin level 0.3-0.7 units/ml Monitor platelets by anticoagulation protocol: Yes   Plan:  Heparin level subtherapeutic Give 2100 units bolus x 1 Increase heparin infusion to 1400 units/hr Check anti-Xa level in 6 hours and daily while on heparin Continue to monitor H&H and platelets  Merryl Hacker, PharmD Clinical Pharmacist  06/04/2023,8:12 PM

## 2023-06-04 NOTE — ED Provider Notes (Signed)
 [facility] Provider Note    Event Date/Time   First MD Initiated Contact with Patient 06/04/23 0848     (approximate)   History   Leg Pain   HPI  Tyler Stanley is a 43 y.o. male with no significant past medical history presents emergency department complaining of left calf pain for 4 to 5 days.  Patient states there has been some swelling, no history of blood clots, feels better when it is elevated.  No known injury.  No recent trips, no history of a blood clot, patient is a smoker.      Physical Exam   Triage Vital Signs: ED Triage Vitals  Encounter Vitals Group     BP 06/04/23 0836 126/89     Systolic BP Percentile --      Diastolic BP Percentile --      Pulse Rate 06/04/23 0836 (!) 102     Resp 06/04/23 0836 16     Temp 06/04/23 0836 (!) 97.5 F (36.4 C)     Temp Source 06/04/23 0836 Oral     SpO2 06/04/23 0836 94 %     Weight 06/04/23 0837 155 lb (70.3 kg)     Height 06/04/23 0837 6' (1.829 m)     Head Circumference --      Peak Flow --      Pain Score 06/04/23 0836 9     Pain Loc --      Pain Education --      Exclude from Growth Chart --     Most recent vital signs: Vitals:   06/04/23 1236 06/04/23 1425  BP: 111/83   Pulse: 96   Resp: 18   Temp: 97.8 F (36.6 C)   SpO2: 100% 100%     General: Awake, no distress.   CV:  Good peripheral perfusion. regular rate and  rhythm Resp:  Normal effort.  Abd:  No distention.   Other:  Left lower extremity with some warmth, tenderness posterior, neurovascular intact   ED Results / Procedures / Treatments   Labs (all labs ordered are listed, but only abnormal results are displayed) Labs Reviewed  COMPREHENSIVE METABOLIC PANEL - Abnormal; Notable for the following components:      Result Value   Albumin 3.0 (*)    All other components within normal limits  CBC WITH DIFFERENTIAL/PLATELET  PROTIME-INR  APTT  URINE DRUG SCREEN, QUALITATIVE ([facility] ONLY)  HIV ANTIBODY (ROUTINE  TESTING W REFLEX)  HEPARIN LEVEL (UNFRACTIONATED)     EKG     RADIOLOGY Ultrasound left lower extremity    PROCEDURES:   Procedures Chief Complaint  Patient presents with   Leg Pain      MEDICATIONS ORDERED IN ED: Medications  heparin ADULT infusion 100 units/mL (25000 units/253mL) (1,200 Units/hr Intravenous New Bag/Given 06/04/23 1253)  ondansetron (ZOFRAN) injection 4 mg (has no administration in time range)  pantoprazole (PROTONIX) EC tablet 40 mg (40 mg Oral Given 06/04/23 1452)  oxyCODONE (Oxy IR/ROXICODONE) immediate release tablet 5 mg (has no administration in time range)  acetaminophen (TYLENOL) tablet 650 mg (has no administration in time range)    Or  acetaminophen (TYLENOL) suppository 650 mg (has no administration in time range)  heparin bolus via infusion 5,000 Units (5,000 Units Intravenous Bolus from Bag 06/04/23 1253)     IMPRESSION / MDM / ASSESSMENT AND PLAN / ED COURSE  I reviewed the triage vital signs and the nursing notes.  Differential diagnosis includes, but is not limited to, DVT, cellulitis, muscle strain  Patient's presentation is most consistent with acute illness / injury with system symptoms.   Ultrasound left lower extremity  Ultrasound left lower extremity independently reviewed interpreted by me as being positive for DVT, radiologist called to discuss DVT being from the calf to mid femoral vein  Consult vascular, Dr. Wyn Quaker recommends heparin at this time.  Sent his NP to evaluate patient.  NP states thrombectomy less likely at this time.  Most likely keep for admission for 24 to 48 hours on heparin and discharged on Eliquis  I did discuss these findings and plan with the patient.  He is agreeable to be admitted.  Will go ahead and get labs, heparin ordered, consult to hospitalist for admission  Labs are reassuring Hospitalist agrees for admission.  Patient stable at this time   FINAL CLINICAL  IMPRESSION(S) / ED DIAGNOSES   Final diagnoses:  Acute deep vein thrombosis (DVT) of left lower extremity, unspecified vein (HCC)     Rx / DC Orders   ED Discharge Orders     None        Note:  This document was prepared using Dragon voice recognition software and may include unintentional dictation errors.    Faythe Ghee, PA-C 06/04/23 1501    Concha Se, MD 06/05/23 [phone]

## 2023-06-04 NOTE — Consult Note (Signed)
Hospital Consult    Reason for Consult:  Left Lower Extremity DVT Requesting Physician:  Greig Right PA MRN #:  [id number]  History of Present Illness: This is a 43 y.o. male with no significant past medical history presents emergency department complaining of left calf pain for 4 to 5 days.  Patient states there has been some swelling, no history of blood clots, feels better when it is elevated.  No known injury.  No recent trips, no history of a blood clot, patient is a smoker.   On exam the patient is resting in the emergency department with his left leg elevated.  Patient's bilateral lower extremities appear to be the same.  Very little slight edema to his left lower extremity.  Both extremities warm to touch with palpable pulses.  No open sores or ulcers to note.  No cellulitis to note.  Vascular surgery consulted to evaluate  Past Medical History:  Diagnosis Date   ADHD     History reviewed. No pertinent surgical history.  No Known Allergies  Prior to Admission medications   Medication Sig Start Date End Date Taking? Authorizing Provider  ibuprofen (ADVIL) 800 MG tablet Take 1 tablet (800 mg total) by mouth every 8 (eight) hours as needed for moderate pain. 09/18/20   Shaune Pollack, MD    Social History   Socioeconomic History   Marital status: Single    Spouse name: Not on file   Number of children: Not on file   Years of education: Not on file   Highest education level: Not on file  Occupational History   Not on file  Tobacco Use   Smoking status: Every Day    Current packs/day: 0.50    Types: Cigarettes   Smokeless tobacco: Never  Vaping Use   Vaping status: Never Used  Substance and Sexual Activity   Alcohol use: Not Currently   Drug use: Not Currently   Sexual activity: Yes  Other Topics Concern   Not on file  Social History Narrative   Not on file   Social Drivers of Health   Financial Resource Strain: Not on file  Food Insecurity: Not on file   Transportation Needs: Not on file  Physical Activity: Not on file  Stress: Not on file  Social Connections: Not on file  Intimate Partner Violence: Not on file     History reviewed. No pertinent family history.  ROS: Otherwise negative unless mentioned in HPI  Physical Examination  Vitals:   06/04/23 0836  BP: 126/89  Pulse: (!) 102  Resp: 16  Temp: (!) 97.5 F (36.4 C)  SpO2: 94%   Body mass index is 21.02 kg/m.  General:  WDWN in NAD Gait: Not observed HENT: WNL, normocephalic Pulmonary: normal non-labored breathing, without Rales, rhonchi,  wheezing Cardiac: regular, without  Murmurs, rubs or gallops; without carotid bruits Abdomen: Positive Bowel sounds throughout, soft, NT/ND, no masses Skin: without rashes Vascular Exam/Pulses: Palpable pulses throughout, Left lower extremity with little to no edema. Warm to touch with palpable pulses.  Extremities: without ischemic changes, without Gangrene , without cellulitis; without open wounds;  Musculoskeletal: no muscle wasting or atrophy  Neurologic: A&O X 3;  No focal weakness or paresthesias are detected; speech is fluent/normal Psychiatric:  The pt has Normal affect. Lymph:  Unremarkable  CBC    Component Value Date/Time   WBC 8.6 09/18/2020 0946   RBC 5.19 09/18/2020 0946   HGB 16.1 09/18/2020 0946   HGB 15.6 02/01/2019 1221  HCT 47.7 09/18/2020 0946   HCT 46.2 02/01/2019 1221   PLT 184 09/18/2020 0946   PLT 208 02/01/2019 1221   MCV 91.9 09/18/2020 0946   MCV 91 02/01/2019 1221   MCH 31.0 09/18/2020 0946   MCHC 33.8 09/18/2020 0946   RDW 12.2 09/18/2020 0946   RDW 12.6 02/01/2019 1221   LYMPHSABS 2.1 02/01/2019 1221   EOSABS 0.1 02/01/2019 1221   BASOSABS 0.0 02/01/2019 1221    BMET    Component Value Date/Time   NA 136 09/18/2020 0946   NA 140 02/01/2019 1221   K 4.0 09/18/2020 0946   CL 100 09/18/2020 0946   CO2 26 09/18/2020 0946   GLUCOSE 91 09/18/2020 0946   BUN 11 09/18/2020 0946    BUN 13 02/01/2019 1221   CREATININE 0.84 09/18/2020 0946   CALCIUM 9.6 09/18/2020 0946   GFRNONAA >60 09/18/2020 0946   GFRAA 128 02/01/2019 1221    COAGS: No results found for: "INR", "PROTIME"   Non-Invasive Vascular Imaging:   EXAM:06/04/23 LEFT LOWER EXTREMITY VENOUS DOPPLER ULTRASOUND   TECHNIQUE: Gray-scale sonography with compression, as well as color and duplex ultrasound, were performed to evaluate the deep venous system(s) from the level of the common femoral vein through the popliteal and proximal calf veins.   COMPARISON:  None Available.   FINDINGS: VENOUS   Echogenic thrombus is present within calf veins extending to the level of mid femoral vein. The calf veins, popliteal, and distal femoral veins are noncompressible. The proximal femoral vein is patent and compressible, however there is absence of normal respiratory plasticity and response to augmentation.   Normal compressibility of the common femoral vein. Visualized portions of profunda femoral vein and great saphenous vein unremarkable.   Limited views of the contralateral common femoral vein are unremarkable.   OTHER   None.   Limitations: none   IMPRESSION: Acute deep venous thrombosis of the left lower extremity extending from the calf veins to the level of mid femoral vein.  Statin:  No. Beta Blocker:  No. Aspirin:  No. ACEI:  No. ARB:  No. CCB use:  No Other antiplatelets/anticoagulants:  No.    ASSESSMENT/PLAN: This is a 43 y.o. male who presents to [facility] emergency department with his father at his side for left lower extremity pain increasing over the last 4 to 5 days.  Patient has no significant medical history to report.  Denies any drug use.  Has no history of blood clots but does endorse smoking.  Upon workup patient was noted to have a deep vein thrombosis of the left lower extremity extending from his calf veins to the level of the mid femoral vein just above the  knee.  After reviewing the patient's ultrasound with Dr. Festus Barren MD vascular surgery recommends heparin infusion as anticoagulation for the next 24 to 48 hours.  We do not recommend a left lower extremity intervention requiring a thrombectomy at this time.  However we will reevaluate after 24 to 48 hours of anticoagulation.  If the patient's symptoms worsen we would reevaluate for left lower extremity thrombectomy on Monday, 06/07/2023.  However with the patient's current symptoms I do not feel he would benefit any from a thrombectomy.  If the patient's current symptoms remain or appear to be resolving anticoagulation would be the treatment of choice.  Heparin infusion at that time could be discontinued and patient started on Eliquis 10 mg twice daily for 5 days then reduce the dose to 5 mg twice daily  indefinitely.  If medically stable patient can be discharged to follow-up with vein and vascular surgery in clinic in 1 month.   I discussed the case in detail with Dr. Festus Barren MD and he agrees with the plan   Marcie Bal [facility] 06/04/2023 12:22 PM

## 2023-06-05 DIAGNOSIS — I824Z2 Acute embolism and thrombosis of unspecified deep veins of left distal lower extremity: Secondary | ICD-10-CM | POA: Diagnosis not present

## 2023-06-05 DIAGNOSIS — R1310 Dysphagia, unspecified: Secondary | ICD-10-CM

## 2023-06-05 DIAGNOSIS — R110 Nausea: Secondary | ICD-10-CM | POA: Diagnosis not present

## 2023-06-05 DIAGNOSIS — R634 Abnormal weight loss: Secondary | ICD-10-CM | POA: Insufficient documentation

## 2023-06-05 DIAGNOSIS — K2970 Gastritis, unspecified, without bleeding: Secondary | ICD-10-CM | POA: Diagnosis not present

## 2023-06-05 DIAGNOSIS — R112 Nausea with vomiting, unspecified: Secondary | ICD-10-CM

## 2023-06-05 DIAGNOSIS — F17200 Nicotine dependence, unspecified, uncomplicated: Secondary | ICD-10-CM | POA: Diagnosis not present

## 2023-06-05 DIAGNOSIS — R1312 Dysphagia, oropharyngeal phase: Secondary | ICD-10-CM | POA: Diagnosis not present

## 2023-06-05 DIAGNOSIS — I82402 Acute embolism and thrombosis of unspecified deep veins of left lower extremity: Secondary | ICD-10-CM | POA: Diagnosis present

## 2023-06-05 DIAGNOSIS — F1290 Cannabis use, unspecified, uncomplicated: Secondary | ICD-10-CM | POA: Diagnosis not present

## 2023-06-05 DIAGNOSIS — K222 Esophageal obstruction: Secondary | ICD-10-CM | POA: Diagnosis not present

## 2023-06-05 DIAGNOSIS — C160 Malignant neoplasm of cardia: Secondary | ICD-10-CM | POA: Diagnosis not present

## 2023-06-05 LAB — HIV ANTIBODY (ROUTINE TESTING W REFLEX): HIV Screen 4th Generation wRfx: NONREACTIVE

## 2023-06-05 LAB — URINALYSIS, COMPLETE (UACMP) WITH MICROSCOPIC
Bacteria, UA: NONE SEEN
Bilirubin Urine: NEGATIVE
Glucose, UA: NEGATIVE mg/dL
Hgb urine dipstick: NEGATIVE
Ketones, ur: NEGATIVE mg/dL
Leukocytes,Ua: NEGATIVE
Nitrite: NEGATIVE
Protein, ur: NEGATIVE mg/dL
Specific Gravity, Urine: 1.025 (ref 1.005–1.030)
pH: 5 (ref 5.0–8.0)

## 2023-06-05 LAB — HEPARIN LEVEL (UNFRACTIONATED)
Heparin Unfractionated: 0.31 [IU]/mL (ref 0.30–0.70)
Heparin Unfractionated: 0.16 [IU]/mL — ABNORMAL LOW (ref 0.30–0.70)
Heparin Unfractionated: 0.24 [IU]/mL — ABNORMAL LOW (ref 0.30–0.70)

## 2023-06-05 LAB — CBC
HCT: 36.8 % — ABNORMAL LOW (ref 39.0–52.0)
Hemoglobin: 12.7 g/dL — ABNORMAL LOW (ref 13.0–17.0)
MCH: 29.7 pg (ref 26.0–34.0)
MCHC: 34.5 g/dL (ref 30.0–36.0)
MCV: 86 fL (ref 80.0–100.0)
Platelets: 235 10*3/uL (ref 150–400)
RBC: 4.28 MIL/uL (ref 4.22–5.81)
RDW: 11.9 % (ref 11.5–15.5)
WBC: 8.5 10*3/uL (ref 4.0–10.5)
nRBC: 0 % (ref 0.0–0.2)

## 2023-06-05 LAB — URINE DRUG SCREEN, QUALITATIVE (ARMC ONLY)
Amphetamines, Ur Screen: POSITIVE — AB
Barbiturates, Ur Screen: NOT DETECTED
Benzodiazepine, Ur Scrn: NOT DETECTED
Cannabinoid 50 Ng, Ur ~~LOC~~: POSITIVE — AB
Cocaine Metabolite,Ur ~~LOC~~: NOT DETECTED
MDMA (Ecstasy)Ur Screen: NOT DETECTED
Methadone Scn, Ur: NOT DETECTED
Opiate, Ur Screen: NOT DETECTED
Phencyclidine (PCP) Ur S: NOT DETECTED
Tricyclic, Ur Screen: NOT DETECTED

## 2023-06-05 MED ORDER — PANTOPRAZOLE SODIUM 40 MG IV SOLR
40.0000 mg | Freq: Two times a day (BID) | INTRAVENOUS | Status: DC
  Administered 2023-06-05 – 2023-06-10 (×11): 40 mg via INTRAVENOUS
  Filled 2023-06-05 (×11): qty 10

## 2023-06-05 MED ORDER — NICOTINE POLACRILEX 2 MG MT GUM
2.0000 mg | CHEWING_GUM | OROMUCOSAL | Status: DC | PRN
  Administered 2023-06-05: 2 mg via ORAL
  Filled 2023-06-05: qty 1

## 2023-06-05 MED ORDER — HEPARIN BOLUS VIA INFUSION
2000.0000 [IU] | Freq: Once | INTRAVENOUS | Status: AC
  Administered 2023-06-05: 2000 [IU] via INTRAVENOUS
  Filled 2023-06-05: qty 2000

## 2023-06-05 MED ORDER — LACTATED RINGERS IV SOLN
INTRAVENOUS | Status: AC
  Administered 2023-06-05: 100 mL via INTRAVENOUS

## 2023-06-05 MED ORDER — HEPARIN BOLUS VIA INFUSION
1000.0000 [IU] | Freq: Once | INTRAVENOUS | Status: AC
  Administered 2023-06-05: 1000 [IU] via INTRAVENOUS
  Filled 2023-06-05: qty 1000

## 2023-06-05 NOTE — Plan of Care (Signed)
  Problem: Education: Goal: Knowledge of General Education information will improve Description: Including pain rating scale, medication(s)/side effects and non-pharmacologic comfort measures Outcome: Progressing   Problem: Clinical Measurements: Goal: Will remain free from infection Outcome: Progressing Goal: Diagnostic test results will improve Outcome: Progressing Goal: Respiratory complications will improve Outcome: Progressing Goal: Cardiovascular complication will be avoided Outcome: Progressing   Problem: Nutrition: Goal: Adequate nutrition will be maintained Outcome: Progressing   Problem: Coping: Goal: Level of anxiety will decrease Outcome: Progressing   Problem: Safety: Goal: Ability to remain free from injury will improve Outcome: Progressing   Problem: Skin Integrity: Goal: Risk for impaired skin integrity will decrease Outcome: Progressing

## 2023-06-05 NOTE — Consult Note (Signed)
 PHARMACY - ANTICOAGULATION CONSULT NOTE  Pharmacy Consult for Heparin Indication: DVT  No Known Allergies  Patient Measurements: Height: 6' (182.9 cm) Weight: 70.3 kg (155 lb) IBW/kg (Calculated) : 77.6 Heparin Dosing Weight: 70.3 kg  Vital Signs: Temp: 99.2 F (37.3 C) (02/22 0740) Temp Source: Oral (02/22 0740) BP: 125/93 (02/22 0740) Pulse Rate: 107 (02/22 0740)  Labs: Recent Labs    06/04/23 1220 06/04/23 1917 06/05/23 0327 06/05/23 1000  HGB 13.2  --  12.7*  --   HCT 39.7  --  36.8*  --   PLT 241  --  235  --   APTT 34  --   --   --   LABPROT 14.8  --   --   --   INR 1.1  --   --   --   HEPARINUNFRC  --  <0.10* 0.31 0.16*  CREATININE 0.77  --   --   --     Estimated Creatinine Clearance: 118.4 mL/min (by C-G formula based on SCr of 0.77 mg/dL).   Medical History: Past Medical History:  Diagnosis Date   ADHD     Pertinent Medications:  No history of chronic anticoagulant use PTA.  Assessment: 43 y.o. male with no significant past medical history presents emergency department complaining of left calf pain for 4 to 5 days.  Patient states there has been some swelling, no history of blood clots, feels better when it is elevated.  No known injury. Vascular to see patient. Pharmacy has been consulted to initiate and monitor continuous heparin infusion.  Date/Time HL Rate  Comment 2/21 1917 <0.1 1200 units/hr Subtherapeutic 2/22 0327 0.31 1400 un/hr Therapeutic x 1 2/22 1000 0.16 1400 units/hr   Goal of Therapy:  Heparin level 0.3-0.7 units/ml Monitor platelets by anticoagulation protocol: Yes   Plan:  Heparin level is subtherapeutic. Will give heparin bolus of 2000 units x 1 and increase heparin infusion to 1600 units/hr. Recheck heparin level in 6 hours. CBC daily while on heparin.   Paschal Dopp, PharmD, BCPS 06/05/2023 10:53 AM
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
## 2023-06-05 NOTE — Consult Note (Signed)
 PHARMACY - ANTICOAGULATION CONSULT NOTE  Pharmacy Consult for Heparin Indication: DVT  No Known Allergies  Patient Measurements: Height: 6' (182.9 cm) Weight: 70.3 kg (155 lb) IBW/kg (Calculated) : 77.6 Heparin Dosing Weight: 70.3 kg  Vital Signs: Temp: 98.8 F (37.1 C) (02/22 1623) Temp Source: Oral (02/22 0740) BP: 147/103 (02/22 1623) Pulse Rate: 106 (02/22 1623)  Labs: Recent Labs    06/04/23 1220 06/04/23 1917 06/05/23 0327 06/05/23 1000 06/05/23 1654  HGB 13.2  --  12.7*  --   --   HCT 39.7  --  36.8*  --   --   PLT 241  --  235  --   --   APTT 34  --   --   --   --   LABPROT 14.8  --   --   --   --   INR 1.1  --   --   --   --   HEPARINUNFRC  --    < > 0.31 0.16* 0.24*  CREATININE 0.77  --   --   --   --    < > = values in this interval not displayed.    Estimated Creatinine Clearance: 118.4 mL/min (by C-G formula based on SCr of 0.77 mg/dL).   Medical History: Past Medical History:  Diagnosis Date   ADHD     Pertinent Medications:  No history of chronic anticoagulant use PTA.  Assessment: 43 y.o. male with no significant past medical history presents emergency department complaining of left calf pain for 4 to 5 days.  Patient states there has been some swelling, no history of blood clots, feels better when it is elevated.  No known injury. Vascular to see patient. Pharmacy has been consulted to initiate and monitor continuous heparin infusion.  Date/Time HL Rate  Comment 2/21 1917 <0.1 1200 units/hr Subtherapeutic 2/22 0327 0.31 1400 un/hr Therapeutic x 1 2/22 1000 0.16 1400 units/hr  Subtherapeutic 2/22 1654 0.24 1600 units/hr Subtherapeutic  Goal of Therapy:  Heparin level 0.3-0.7 units/ml Monitor platelets by anticoagulation protocol: Yes   Plan:  Heparin level is subtherapeutic. Will give heparin bolus of 1000 units x 1 and increase heparin infusion to 1700 units/hr. Recheck heparin level in 6 hours. CBC daily while on heparin.   Paulita Fujita, PharmD Clinical Pharmacist  06/05/2023 5:50 PM

## 2023-06-05 NOTE — Progress Notes (Signed)
 Progress Note   Patient: Tyler Stanley [id number]:[id number] DOB: 10-12-1980 DOA: 06/04/2023     0 DOS: the patient was seen and examined on 06/05/2023   Brief hospital course: Tyler Stanley is a 43 y.o. male with medical history significant of ADHD, scented with worsening of left calf pain. Also endorses  frequent episodes of nauseous throwing up especially after meal, feels food stuck in the middle of the chest, followed by throwing up of undigested food, and he frequently vomited clear secretions. Lost significant amount of weight.  DVT study positive for acute DVT in left calf to mid femoral vein.  Patient is started on IV heparin drip, no vascular surgery recommended.  GI evaluation called for further management.  Assessment and Plan: Acute nonprovoked left lower extremity DVT Vascular surgery reviewed the case and recommend no embolectomy.  Continue heparin drip for at least 24 hours then switch to Eliquis upon discharge. No chest pain shortness of breath, heart rate BP and oxygen level stable, no PE study obtained. He will need outpatient hematology follow-up for hypercoagulable state workup.     Intractable nauseous vomiting with weight loss Subacute dysphagia- SLP evaluation - no needs identified. GERD symptoms with weight loss. Continue IV PPI. Continue antiemetics, gentle IV hydration. Diet clears for now. GI evaluation called for further management.  Tobacco abuse Marijuana use Urine drug screen pending. Advised smoking cessation. Nicotine patch ordered.     Out of bed to chair. Incentive spirometry. Nursing supportive care. Fall, aspiration precautions. DVT prophylaxis   Code Status: Full Code  Subjective: Patient is seen and examined today morning. States that he has difficulty swallowing, has nausea, belches and burps. Unable to tolerate diet. Has been losing weight since 3 months. Mother at bedside asks for GI evaluation.  Physical Exam: Vitals:   06/04/23 1425  06/04/23 1525 06/04/23 2202 06/05/23 0740  BP:  130/85 130/87 (!) 125/93  Pulse:  (!) 109 (!) 105 (!) 107  Resp:  14 18 16   Temp:  98 F (36.7 C) 100.2 F (37.9 C) 99.2 F (37.3 C)  TempSrc:  Oral Oral Oral  SpO2: 100% 100% 99% 99%  Weight:      Height:        General - Young  Caucasian thin built male, in distress due to nausea. HEENT - PERRLA, EOMI, atraumatic head, non tender sinuses. Lung - Clear, no rales, rhonchi, wheezes. Heart - S1, S2 heard, no murmurs, rubs, no pedal edema. Abdomen - Soft, non tender, bowel sounds good Neuro - Alert, awake and oriented x 3, non focal exam. Skin - Warm and dry. Left calf tenderness  Data Reviewed:      Latest Ref Rng & Units 06/05/2023    3:27 AM 06/04/2023   12:20 PM 09/18/2020    9:46 AM  CBC  WBC 4.0 - 10.5 K/uL 8.5  8.3  8.6   Hemoglobin 13.0 - 17.0 g/dL 78.4  69.6  29.5   Hematocrit 39.0 - 52.0 % 36.8  39.7  47.7   Platelets 150 - 400 K/uL 235  241  184       Latest Ref Rng & Units 06/04/2023   12:20 PM 09/18/2020    9:46 AM 02/01/2019   12:21 PM  BMP  Glucose 70 - 99 mg/dL 89  91  284   BUN 6 - 20 mg/dL 13  11  13    Creatinine 0.61 - 1.24 mg/dL 1.32  4.40  1.02   BUN/Creat Ratio 9 -  20   15   Sodium 135 - 145 mmol/L 135  136  140   Potassium 3.5 - 5.1 mmol/L 4.3  4.0  4.8   Chloride 98 - 111 mmol/L 98  100  101   CO2 22 - 32 mmol/L 25  26  27    Calcium 8.9 - 10.3 mg/dL 9.0  9.6  9.5    US Venous Img Lower Unilateral Left Result Date: 06/04/2023 CLINICAL DATA:  Several day history of left lower extremity pain, swelling, and warmth EXAM: LEFT LOWER EXTREMITY VENOUS DOPPLER ULTRASOUND TECHNIQUE: Gray-scale sonography with compression, as well as color and duplex ultrasound, were performed to evaluate the deep venous system(s) from the level of the common femoral vein through the popliteal and proximal calf veins. COMPARISON:  None Available. FINDINGS: VENOUS Echogenic thrombus is present within calf veins extending to the  level of mid femoral vein. The calf veins, popliteal, and distal femoral veins are noncompressible. The proximal femoral vein is patent and compressible, however there is absence of normal respiratory plasticity and response to augmentation. Normal compressibility of the common femoral vein. Visualized portions of profunda femoral vein and great saphenous vein unremarkable. Limited views of the contralateral common femoral vein are unremarkable. OTHER None. Limitations: none IMPRESSION: Acute deep venous thrombosis of the left lower extremity extending from the calf veins to the level of mid femoral vein. These results will be called to the ordering clinician or representative by the Radiologist Assistant, and communication documented in the PACS or [employer]. Electronically Signed   By: Agustin Cree M.D.   On: 06/04/2023 11:52   Family Communication: Discussed with patient and family at bedside, they understand and agree. All questions answereed.  Disposition: Status is: Observation The patient remains OBS appropriate and will d/c before 2 midnights.  Planned Discharge Destination: Home     Time spent: 41 minutes  Author: Marcelino Duster, MD 06/05/2023 12:34 PM Secure chat 7am to 7pm For on call review [email or web address].

## 2023-06-05 NOTE — Consult Note (Signed)
 PHARMACY - ANTICOAGULATION CONSULT NOTE  Pharmacy Consult for Heparin Indication: DVT  No Known Allergies  Patient Measurements: Height: 6' (182.9 cm) Weight: 70.3 kg (155 lb) IBW/kg (Calculated) : 77.6 Heparin Dosing Weight: 70.3 kg  Vital Signs: Temp: 100.2 F (37.9 C) (02/21 2202) Temp Source: Oral (02/21 2202) BP: 130/87 (02/21 2202) Pulse Rate: 105 (02/21 2202)  Labs: Recent Labs    06/04/23 1220 06/04/23 1917 06/05/23 0327  HGB 13.2  --  12.7*  HCT 39.7  --  36.8*  PLT 241  --  235  APTT 34  --   --   LABPROT 14.8  --   --   INR 1.1  --   --   HEPARINUNFRC  --  <0.10* 0.31  CREATININE 0.77  --   --     Estimated Creatinine Clearance: 118.4 mL/min (by C-G formula based on SCr of 0.77 mg/dL).   Medical History: Past Medical History:  Diagnosis Date   ADHD     Pertinent Medications:  No history of chronic anticoagulant use PTA.  Assessment: 43 y.o. male with no significant past medical history presents emergency department complaining of left calf pain for 4 to 5 days.  Patient states there has been some swelling, no history of blood clots, feels better when it is elevated.  No known injury. Vascular to see patient. Pharmacy has been consulted to initiate and monitor continuous heparin infusion.  Date/Time HL Rate  Comment 2/21 1917 <0.1 1200 units/hr Subtherapeutic 2/22 0327 0.31 1400 un/hr Therapeutic x 1  Goal of Therapy:  Heparin level 0.3-0.7 units/ml Monitor platelets by anticoagulation protocol: Yes   Plan:  Continue heparin infusion at 1400 units/hr Recheck anti-Xa level in 6 hours to confirm Continue to monitor H&H and platelets  Otelia Sergeant, PharmD, [facility] 06/05/2023 4:01 AM

## 2023-06-06 ENCOUNTER — Inpatient Hospital Stay: Payer: 59

## 2023-06-06 ENCOUNTER — Encounter
Admission: EM | Disposition: A | Discharge status: Home / Self Care | Payer: Self-pay | Source: Home / Self Care | Attending: Internal Medicine

## 2023-06-06 ENCOUNTER — Observation Stay: Payer: Self-pay | Admitting: Certified Registered"

## 2023-06-06 ENCOUNTER — Encounter: Payer: Self-pay | Admitting: Internal Medicine

## 2023-06-06 DIAGNOSIS — D689 Coagulation defect, unspecified: Secondary | ICD-10-CM | POA: Diagnosis not present

## 2023-06-06 DIAGNOSIS — R590 Localized enlarged lymph nodes: Secondary | ICD-10-CM | POA: Diagnosis not present

## 2023-06-06 DIAGNOSIS — R222 Localized swelling, mass and lump, trunk: Secondary | ICD-10-CM | POA: Diagnosis not present

## 2023-06-06 DIAGNOSIS — I10 Essential (primary) hypertension: Secondary | ICD-10-CM | POA: Diagnosis not present

## 2023-06-06 DIAGNOSIS — Z515 Encounter for palliative care: Secondary | ICD-10-CM | POA: Diagnosis not present

## 2023-06-06 DIAGNOSIS — E785 Hyperlipidemia, unspecified: Secondary | ICD-10-CM | POA: Diagnosis not present

## 2023-06-06 DIAGNOSIS — K222 Esophageal obstruction: Secondary | ICD-10-CM

## 2023-06-06 DIAGNOSIS — R932 Abnormal findings on diagnostic imaging of liver and biliary tract: Secondary | ICD-10-CM | POA: Diagnosis not present

## 2023-06-06 DIAGNOSIS — E43 Unspecified severe protein-calorie malnutrition: Secondary | ICD-10-CM | POA: Diagnosis not present

## 2023-06-06 DIAGNOSIS — I2510 Atherosclerotic heart disease of native coronary artery without angina pectoris: Secondary | ICD-10-CM | POA: Diagnosis not present

## 2023-06-06 DIAGNOSIS — F909 Attention-deficit hyperactivity disorder, unspecified type: Secondary | ICD-10-CM | POA: Diagnosis not present

## 2023-06-06 DIAGNOSIS — R112 Nausea with vomiting, unspecified: Secondary | ICD-10-CM | POA: Diagnosis not present

## 2023-06-06 DIAGNOSIS — R000 Tachycardia, unspecified: Secondary | ICD-10-CM | POA: Diagnosis not present

## 2023-06-06 DIAGNOSIS — R110 Nausea: Secondary | ICD-10-CM | POA: Diagnosis not present

## 2023-06-06 DIAGNOSIS — K2970 Gastritis, unspecified, without bleeding: Secondary | ICD-10-CM | POA: Diagnosis not present

## 2023-06-06 DIAGNOSIS — M79662 Pain in left lower leg: Secondary | ICD-10-CM | POA: Diagnosis not present

## 2023-06-06 DIAGNOSIS — R5381 Other malaise: Secondary | ICD-10-CM | POA: Diagnosis not present

## 2023-06-06 DIAGNOSIS — F05 Delirium due to known physiological condition: Secondary | ICD-10-CM | POA: Diagnosis not present

## 2023-06-06 DIAGNOSIS — C159 Malignant neoplasm of esophagus, unspecified: Secondary | ICD-10-CM | POA: Diagnosis not present

## 2023-06-06 DIAGNOSIS — C160 Malignant neoplasm of cardia: Secondary | ICD-10-CM

## 2023-06-06 DIAGNOSIS — I82409 Acute embolism and thrombosis of unspecified deep veins of unspecified lower extremity: Secondary | ICD-10-CM | POA: Diagnosis present

## 2023-06-06 DIAGNOSIS — Z716 Tobacco abuse counseling: Secondary | ICD-10-CM | POA: Diagnosis not present

## 2023-06-06 DIAGNOSIS — R64 Cachexia: Secondary | ICD-10-CM | POA: Diagnosis not present

## 2023-06-06 DIAGNOSIS — I824Z2 Acute embolism and thrombosis of unspecified deep veins of left distal lower extremity: Secondary | ICD-10-CM | POA: Diagnosis not present

## 2023-06-06 DIAGNOSIS — K2289 Other specified disease of esophagus: Secondary | ICD-10-CM | POA: Diagnosis not present

## 2023-06-06 DIAGNOSIS — C787 Secondary malignant neoplasm of liver and intrahepatic bile duct: Secondary | ICD-10-CM | POA: Diagnosis not present

## 2023-06-06 DIAGNOSIS — C155 Malignant neoplasm of lower third of esophagus: Secondary | ICD-10-CM | POA: Diagnosis not present

## 2023-06-06 DIAGNOSIS — R1312 Dysphagia, oropharyngeal phase: Secondary | ICD-10-CM | POA: Diagnosis not present

## 2023-06-06 DIAGNOSIS — I82402 Acute embolism and thrombosis of unspecified deep veins of left lower extremity: Secondary | ICD-10-CM | POA: Diagnosis not present

## 2023-06-06 DIAGNOSIS — Z452 Encounter for adjustment and management of vascular access device: Secondary | ICD-10-CM | POA: Diagnosis not present

## 2023-06-06 DIAGNOSIS — R1314 Dysphagia, pharyngoesophageal phase: Secondary | ICD-10-CM | POA: Diagnosis not present

## 2023-06-06 DIAGNOSIS — R109 Unspecified abdominal pain: Secondary | ICD-10-CM | POA: Diagnosis not present

## 2023-06-06 DIAGNOSIS — F17200 Nicotine dependence, unspecified, uncomplicated: Secondary | ICD-10-CM | POA: Diagnosis not present

## 2023-06-06 DIAGNOSIS — C799 Secondary malignant neoplasm of unspecified site: Secondary | ICD-10-CM | POA: Diagnosis not present

## 2023-06-06 DIAGNOSIS — Z7189 Other specified counseling: Secondary | ICD-10-CM | POA: Diagnosis not present

## 2023-06-06 DIAGNOSIS — F1290 Cannabis use, unspecified, uncomplicated: Secondary | ICD-10-CM | POA: Diagnosis not present

## 2023-06-06 DIAGNOSIS — M62838 Other muscle spasm: Secondary | ICD-10-CM | POA: Diagnosis not present

## 2023-06-06 DIAGNOSIS — F17210 Nicotine dependence, cigarettes, uncomplicated: Secondary | ICD-10-CM | POA: Diagnosis not present

## 2023-06-06 DIAGNOSIS — R1310 Dysphagia, unspecified: Secondary | ICD-10-CM | POA: Diagnosis not present

## 2023-06-06 DIAGNOSIS — K219 Gastro-esophageal reflux disease without esophagitis: Secondary | ICD-10-CM | POA: Diagnosis not present

## 2023-06-06 DIAGNOSIS — R634 Abnormal weight loss: Secondary | ICD-10-CM | POA: Diagnosis not present

## 2023-06-06 HISTORY — PX: ESOPHAGOGASTRODUODENOSCOPY (EGD) WITH PROPOFOL: SHX5813

## 2023-06-06 HISTORY — PX: BIOPSY: SHX5522

## 2023-06-06 LAB — HEPARIN LEVEL (UNFRACTIONATED)
Heparin Unfractionated: 0.25 [IU]/mL — ABNORMAL LOW (ref 0.30–0.70)
Heparin Unfractionated: 0.19 [IU]/mL — ABNORMAL LOW (ref 0.30–0.70)

## 2023-06-06 LAB — CBC
HCT: 36.5 % — ABNORMAL LOW (ref 39.0–52.0)
Hemoglobin: 12.5 g/dL — ABNORMAL LOW (ref 13.0–17.0)
MCH: 29.5 pg (ref 26.0–34.0)
MCHC: 34.2 g/dL (ref 30.0–36.0)
MCV: 86.1 fL (ref 80.0–100.0)
Platelets: 256 10*3/uL (ref 150–400)
RBC: 4.24 MIL/uL (ref 4.22–5.81)
RDW: 11.6 % (ref 11.5–15.5)
WBC: 8.9 10*3/uL (ref 4.0–10.5)
nRBC: 0 % (ref 0.0–0.2)

## 2023-06-06 SURGERY — ESOPHAGOGASTRODUODENOSCOPY (EGD) WITH PROPOFOL
Anesthesia: General

## 2023-06-06 MED ORDER — SUCRALFATE 1 GM/10ML PO SUSP
1.0000 g | Freq: Three times a day (TID) | ORAL | Status: DC
  Administered 2023-06-06 – 2023-06-08 (×6): 1 g via ORAL
  Filled 2023-06-06 (×7): qty 10

## 2023-06-06 MED ORDER — LIDOCAINE 2% (20 MG/ML) 5 ML SYRINGE
INTRAMUSCULAR | Status: DC | PRN
  Administered 2023-06-06: 100 mg via INTRAVENOUS

## 2023-06-06 MED ORDER — ENSURE ENLIVE PO LIQD
237.0000 mL | Freq: Two times a day (BID) | ORAL | Status: DC
  Administered 2023-06-06 – 2023-06-10 (×7): 237 mL via ORAL

## 2023-06-06 MED ORDER — SODIUM CHLORIDE 0.9 % IV SOLN: INTRAVENOUS | Status: DC

## 2023-06-06 MED ORDER — PROPOFOL 1000 MG/100ML IV EMUL
INTRAVENOUS | Status: AC
  Filled 2023-06-06: qty 100

## 2023-06-06 MED ORDER — HEPARIN BOLUS VIA INFUSION
2100.0000 [IU] | Freq: Once | INTRAVENOUS | Status: AC
  Administered 2023-06-06: 2100 [IU] via INTRAVENOUS
  Filled 2023-06-06: qty 2100

## 2023-06-06 MED ORDER — IOHEXOL 300 MG/ML  SOLN
80.0000 mL | Freq: Once | INTRAMUSCULAR | Status: AC | PRN
  Administered 2023-06-06: 80 mL via INTRAVENOUS

## 2023-06-06 MED ORDER — PROPOFOL 10 MG/ML IV BOLUS
INTRAVENOUS | Status: DC | PRN
  Administered 2023-06-06: 30 mg via INTRAVENOUS
  Administered 2023-06-06: 40 mg via INTRAVENOUS
  Administered 2023-06-06 (×3): 20 mg via INTRAVENOUS
  Administered 2023-06-06: 100 mg via INTRAVENOUS
  Administered 2023-06-06 (×2): 30 mg via INTRAVENOUS
  Administered 2023-06-06: 20 mg via INTRAVENOUS
  Administered 2023-06-06: 30 mg via INTRAVENOUS

## 2023-06-06 MED ORDER — HEPARIN BOLUS VIA INFUSION
1100.0000 [IU] | Freq: Once | INTRAVENOUS | Status: AC
Start: 2023-06-06 — End: 2023-06-06
  Administered 2023-06-06: 1100 [IU] via INTRAVENOUS
  Filled 2023-06-06: qty 1100

## 2023-06-06 NOTE — Progress Notes (Signed)
 Heparin gtt verifed by CN running at 41ml/hr.

## 2023-06-06 NOTE — Plan of Care (Signed)
  Problem: Clinical Measurements: Goal: Will remain free from infection Outcome: Progressing Goal: Diagnostic test results will improve Outcome: Progressing   Problem: Activity: Goal: Risk for activity intolerance will decrease Outcome: Progressing   Problem: Nutrition: Goal: Adequate nutrition will be maintained Outcome: Progressing   Problem: Coping: Goal: Level of anxiety will decrease Outcome: Progressing

## 2023-06-06 NOTE — Consult Note (Signed)
 PHARMACY - ANTICOAGULATION CONSULT NOTE  Pharmacy Consult for Heparin Indication: DVT  No Known Allergies  Patient Measurements: Height: 6' (182.9 cm) Weight: 70.3 kg (155 lb) IBW/kg (Calculated) : 77.6 Heparin Dosing Weight: 70.3 kg  Vital Signs: Temp: 98.4 F (36.9 C) (02/23 1601) Temp Source: Oral (02/23 1601) BP: 107/75 (02/23 1601) Pulse Rate: 96 (02/23 1601)  Labs: Recent Labs    06/04/23 1220 06/04/23 1917 06/05/23 0327 06/05/23 1000 06/05/23 1654 06/06/23 0054 06/06/23 1730  HGB 13.2  --  12.7*  --   --  12.5*  --   HCT 39.7  --  36.8*  --   --  36.5*  --   PLT 241  --  235  --   --  256  --   APTT 34  --   --   --   --   --   --   LABPROT 14.8  --   --   --   --   --   --   INR 1.1  --   --   --   --   --   --   HEPARINUNFRC  --    < > 0.31   < > 0.24* 0.25* 0.19*  CREATININE 0.77  --   --   --   --   --   --    < > = values in this interval not displayed.    Estimated Creatinine Clearance: 118.4 mL/min (by C-G formula based on SCr of 0.77 mg/dL).   Medical History: Past Medical History:  Diagnosis Date   ADHD     Pertinent Medications:  No history of chronic anticoagulant use PTA.  Assessment: 43 y.o. male with no significant past medical history presents emergency department complaining of left calf pain for 4 to 5 days.  Patient states there has been some swelling, no history of blood clots, feels better when it is elevated.  No known injury. Vascular to see patient. Pharmacy has been consulted to initiate and monitor continuous heparin infusion.  Date/Time HL Rate  Comment 2/21 1917 <0.1 1200 units/hr Subtherapeutic 2/22 0327 0.31 1400 un/hr Therapeutic x 1 2/22 1000 0.16 1400 units/hr  Subtherapeutic 2/22 1654 0.24 1600 units/hr Subtherapeutic 2/23 0054 0.25 1700 units/hr Subtherapeutic 2/23 1730 0.19 1850 units/hr Subtherapeutic  Goal of Therapy:  Heparin level 0.3-0.7 units/ml Monitor platelets by anticoagulation protocol: Yes    Plan:  Heparin level is subtherapeutic. Will give heparin bolus of 2100 units x 1 and increase heparin infusion to 2000 units/hr. Recheck heparin level in 6 hours. CBC daily while on heparin.   Paulita Fujita, PharmD Clinical Pharmacist  06/06/2023 6:08 PM

## 2023-06-06 NOTE — Progress Notes (Signed)
 Progress Note   Patient: Tyler Stanley ZOX:[id number] DOB: Sep 30, 1980 DOA: 06/04/2023     0 DOS: the patient was seen and examined on 06/06/2023   Brief hospital course: JAQUILLE KAU is a 43 y.o. male with medical history significant of ADHD, scented with worsening of left calf pain. Also endorses  frequent episodes of nauseous throwing up especially after meal, feels food stuck in the middle of the chest, followed by throwing up of undigested food, and he frequently vomited clear secretions. Lost significant amount of weight.  DVT study positive for acute DVT in left calf to mid femoral vein.  Patient is started on IV heparin drip, no vascular surgery recommended.  GI evaluation called, EGD is highly concerning for malignant esophageal stricture at the GE junction and distal esophagus. Pending path report. Oncology evaluation called.  Assessment and Plan: Acute nonprovoked left lower extremity DVT Vascular surgery reviewed the case and recommend no embolectomy.  Continue heparin drip for another day and then switch to Eliquis upon discharge.   Intractable nauseous vomiting with weight loss Subacute dysphagia- SLP evaluation - no needs identified. GERD symptoms with weight loss. Continue IV PPI. GI evaluation appreciated. EGD done is highly concerning for malignant esophageal stricture at GE junction and distal esophagus. Oncology evaluation called who advised CEA, CT chest/ abd/ pelvis, orders placed.  Tobacco abuse Marijuana use Urine drug screen pending. Advised smoking cessation. Nicotine patch ordered.     Out of bed to chair. Incentive spirometry. Nursing supportive care. Fall, aspiration precautions. DVT prophylaxis   Code Status: Full Code  Subjective: Patient is seen and examined today morning after EGD. He feels left leg pain, swelling better. Continues to have nausea, able to tolerate liquids but minimal.   Physical Exam: Vitals:   06/06/23 0900 06/06/23 0915  06/06/23 0930 06/06/23 0948  BP: 108/76 117/86 117/85 115/85  Pulse: (!) 106 (!) 102 (!) 107 98  Resp: 16 19 13 16   Temp:   98.4 F (36.9 C) 98.2 F (36.8 C)  TempSrc:    Oral  SpO2: 96% 98% 98% 100%  Weight:      Height:        General - Young  Caucasian thin built male, distress due to nausea. HEENT - PERRLA, EOMI, atraumatic head, non tender sinuses. Lung - Clear, no rales, rhonchi, wheezes. Heart - S1, S2 heard, no murmurs, rubs, no pedal edema. Abdomen - Soft, non tender, bowel sounds good Neuro - Alert, awake and oriented x 3, non focal exam. Skin - Warm and dry. Left calf swelling improved.  Data Reviewed:      Latest Ref Rng & Units 06/06/2023   12:54 AM 06/05/2023    3:27 AM 06/04/2023   12:20 PM  CBC  WBC 4.0 - 10.5 K/uL 8.9  8.5  8.3   Hemoglobin 13.0 - 17.0 g/dL 81.1  91.4  78.2   Hematocrit 39.0 - 52.0 % 36.5  36.8  39.7   Platelets 150 - 400 K/uL 256  235  241       Latest Ref Rng & Units 06/04/2023   12:20 PM 09/18/2020    9:46 AM 02/01/2019   12:21 PM  BMP  Glucose 70 - 99 mg/dL 89  91  956   BUN 6 - 20 mg/dL 13  11  13    Creatinine 0.61 - 1.24 mg/dL 2.13  0.86  5.78   BUN/Creat Ratio 9 - 20   15   Sodium 135 - 145 mmol/L  135  136  140   Potassium 3.5 - 5.1 mmol/L 4.3  4.0  4.8   Chloride 98 - 111 mmol/L 98  100  101   CO2 22 - 32 mmol/L 25  26  27    Calcium 8.9 - 10.3 mg/dL 9.0  9.6  9.5    No results found.  Family Communication: Discussed with patient and mother at bedside, they understand and agree. All questions answereed.  Disposition: Status is: Observation The patient remains OBS appropriate and will d/c before 2 midnights.  Planned Discharge Destination: Home     Time spent: 42 minutes  Author: Marcelino Duster, MD 06/06/2023 11:44 AM Secure chat 7am to 7pm For on call review [email or web address].

## 2023-06-06 NOTE — Plan of Care (Signed)
   Problem: Coping: Goal: Level of anxiety will decrease Outcome: Progressing   Problem: Pain Managment: Goal: General experience of comfort will improve and/or be controlled Outcome: Progressing

## 2023-06-06 NOTE — Consult Note (Signed)
 PHARMACY - ANTICOAGULATION CONSULT NOTE  Pharmacy Consult for Heparin Indication: DVT  No Known Allergies  Patient Measurements: Height: 6' (182.9 cm) Weight: 70.3 kg (155 lb) IBW/kg (Calculated) : 77.6 Heparin Dosing Weight: 70.3 kg  Vital Signs: Temp: 98.8 F (37.1 C) (02/22 1623) BP: 147/103 (02/22 1623) Pulse Rate: 106 (02/22 1623)  Labs: Recent Labs    06/04/23 1220 06/04/23 1917 06/05/23 0327 06/05/23 1000 06/05/23 1654 06/06/23 0054  HGB 13.2  --  12.7*  --   --  12.5*  HCT 39.7  --  36.8*  --   --  36.5*  PLT 241  --  235  --   --  256  APTT 34  --   --   --   --   --   LABPROT 14.8  --   --   --   --   --   INR 1.1  --   --   --   --   --   HEPARINUNFRC  --    < > 0.31 0.16* 0.24* 0.25*  CREATININE 0.77  --   --   --   --   --    < > = values in this interval not displayed.    Estimated Creatinine Clearance: 118.4 mL/min (by C-G formula based on SCr of 0.77 mg/dL).   Medical History: Past Medical History:  Diagnosis Date   ADHD     Pertinent Medications:  No history of chronic anticoagulant use PTA.  Assessment: 43 y.o. male with no significant past medical history presents emergency department complaining of left calf pain for 4 to 5 days.  Patient states there has been some swelling, no history of blood clots, feels better when it is elevated.  No known injury. Vascular to see patient. Pharmacy has been consulted to initiate and monitor continuous heparin infusion.  Date/Time HL Rate  Comment 2/21 1917 <0.1 1200 units/hr Subtherapeutic 2/22 0327 0.31 1400 un/hr Therapeutic x 1 2/22 1000 0.16 1400 units/hr  Subtherapeutic 2/22 1654 0.24 1600 units/hr Subtherapeutic 2/23 0054 0.25 1700 units/hr Subtherapeutic  Goal of Therapy:  Heparin level 0.3-0.7 units/ml Monitor platelets by anticoagulation protocol: Yes   Plan:  Heparin level is subtherapeutic. Will give heparin bolus of 1100 units x 1 and increase heparin infusion to 1850 units/hr.  Recheck heparin level in 6 hours. CBC daily while on heparin.   Otelia Sergeant, PharmD, [facility] 06/06/2023 1:39 AM

## 2023-06-06 NOTE — Anesthesia Postprocedure Evaluation (Signed)
 Anesthesia Post Note  Patient: Tyler Stanley  Procedure(s) Performed: ESOPHAGOGASTRODUODENOSCOPY (EGD) WITH PROPOFOL BIOPSY  Patient location during evaluation: PACU Anesthesia Type: General Level of consciousness: awake and alert, oriented and patient cooperative Pain management: pain level controlled Vital Signs Assessment: post-procedure vital signs reviewed and stable Respiratory status: spontaneous breathing, nonlabored ventilation and respiratory function stable Cardiovascular status: blood pressure returned to baseline and stable Postop Assessment: adequate PO intake Anesthetic complications: no   No notable events documented.   Last Vitals:  Vitals:   06/06/23 0915 06/06/23 0930  BP: 117/86 117/85  Pulse: (!) 102 (!) 107  Resp: 19 13  Temp:  36.9 C  SpO2: 98% 98%    Last Pain:  Vitals:   06/06/23 0930  TempSrc:   PainSc: 0-No pain                 Reed Breech

## 2023-06-06 NOTE — Transfer of Care (Signed)
 Immediate Anesthesia Transfer of Care Note  Patient: Tyler Stanley  Procedure(s) Performed: ESOPHAGOGASTRODUODENOSCOPY (EGD) WITH PROPOFOL BIOPSY  Patient Location: PACU  Anesthesia Type:General  Level of Consciousness: drowsy  Airway & Oxygen Therapy: Patient Spontanous Breathing  Post-op Assessment: Report given to RN and Post -op Vital signs reviewed and stable  Post vital signs: Reviewed and stable  Last Vitals:  Vitals Value Taken Time  BP 108/76   Temp    Pulse 106 06/06/23 0859  Resp 16 06/06/23 0859  SpO2 96 % 06/06/23 0859  Vitals shown include unfiled device data.  Last Pain:  Vitals:   06/06/23 0744  TempSrc: Temporal  PainSc:          Complications: No notable events documented.

## 2023-06-06 NOTE — Consult Note (Signed)
 Hematology/Oncology Consult note [facility] [phone] [phone]  Patient Care Team: [facility] as PCP - General   Name of the patient: Tyler Stanley  [id number]  09-08-1980    Reason for consult: Esophageal mass   Requesting physician: Dr. Clide Dales  Date of visit: 06/07/2023   History of presenting illness-patient is a 43 year old male who presented to the ER With symptoms of left calf pain and swelling as well as symptoms of nausea vomiting substernal chest pain progressive dysphagia to solids and significant weight loss.Marland Kitchen  He underwent left lower extremity Doppler which was consistent with DVT extending from the calf veins to the level of mid femoral vein.  Patient was seen by GI and underwent upper endoscopy which showed a large submucosal friable and ulcerating mass with bleeding and stigmata of recent bleeding in the distal esophagus and GE junction 40 cm from the incisors.  Adult endoscopy was not able to be traversed and ultraslim scope had to be used.  Biopsies were taken and are currently pending.  I also recommended that patient should get CT chest abdomen and pelvis with contrast when I was consulted yesterday which unfortunately shows evidence of multiple enhancing lesions in the liver consistent with liver metastases.  Probable metastatic adenopathy in the gastrohepatic ligament and celiac nodal stations.  ECOG PS- 2  Pain scale- 0   Review of systems- Review of Systems  Constitutional:  Positive for malaise/fatigue and weight loss. Negative for chills and fever.  HENT:  Negative for congestion, ear discharge and nosebleeds.   Eyes:  Negative for blurred vision.  Respiratory:  Negative for cough, hemoptysis, sputum production, shortness of breath and wheezing.   Cardiovascular:  Negative for chest pain, palpitations, orthopnea and claudication.  Gastrointestinal:  Positive for nausea and vomiting. Negative for  abdominal pain, blood in stool, constipation, diarrhea, heartburn and melena.       Dysphagia  Genitourinary:  Negative for dysuria, flank pain, frequency, hematuria and urgency.  Musculoskeletal:  Negative for back pain, joint pain and myalgias.  Skin:  Negative for rash.  Neurological:  Negative for dizziness, tingling, focal weakness, seizures, weakness and headaches.  Endo/Heme/Allergies:  Does not bruise/bleed easily.  Psychiatric/Behavioral:  Negative for depression and suicidal ideas. The patient does not have insomnia.     No Known Allergies  Patient Active Problem List   Diagnosis Date Noted   Mass of esophagus determined by endoscopy 06/06/2023   Stricture and stenosis of esophagus 06/06/2023   Acute DVT (deep venous thrombosis) (HCC) 06/06/2023   Unintentional weight loss 06/05/2023   Nausea and vomiting 06/05/2023   Acute deep vein thrombosis (DVT) of left lower extremity (HCC) 06/05/2023   Acute deep vein thrombosis (DVT) of distal vein of left lower extremity (HCC) 06/04/2023   Dysphagia 06/04/2023   DVT (deep vein thrombosis) in pregnancy 06/04/2023   Smoking 01/24/2019   Essential hypertension 01/24/2019   Encounter to establish care 10/25/2018   Insect bite 10/25/2018   Tick bites 10/25/2018     Past Medical History:  Diagnosis Date   ADHD      History reviewed. No pertinent surgical history.  Social History   Socioeconomic History   Marital status: Single    Spouse name: Not on file   Number of children: Not on file   Years of education: Not on file   Highest education level: Not on file  Occupational History   Not on file  Tobacco Use   Smoking status:  Every Day    Current packs/day: 0.50    Types: Cigarettes   Smokeless tobacco: Never  Vaping Use   Vaping status: Never Used  Substance and Sexual Activity   Alcohol use: Not Currently   Drug use: Not Currently    Types: Marijuana   Sexual activity: Yes  Other Topics Concern   Not on file   Social History Narrative   Not on file   Social Drivers of Health   Financial Resource Strain: Not on file  Food Insecurity: No Food Insecurity (06/04/2023)   Hunger Vital Sign    Worried About Running Out of Food in the Last Year: Never true    Ran Out of Food in the Last Year: Never true  Transportation Needs: No Transportation Needs (06/04/2023)   PRAPARE - Administrator, Civil Service (Medical): No    Lack of Transportation (Non-Medical): No  Physical Activity: Not on file  Stress: Not on file  Social Connections: Not on file  Intimate Partner Violence: Not At Risk (06/04/2023)   Humiliation, Afraid, Rape, and Kick questionnaire    Fear of Current or Ex-Partner: No    Emotionally Abused: No    Physically Abused: No    Sexually Abused: No     History reviewed. No pertinent family history.   Current Facility-Administered Medications:    acetaminophen (TYLENOL) tablet 650 mg, 650 mg, Oral, Q6H PRN **OR** acetaminophen (TYLENOL) suppository 650 mg, 650 mg, Rectal, Q6H PRN, Emeline General, MD   feeding supplement (ENSURE ENLIVE / ENSURE PLUS) liquid 237 mL, 237 mL, Oral, BID BM, Vanga, Loel Dubonnet, MD, 237 mL at 06/06/23 1352   heparin ADULT infusion 100 units/mL (25000 units/226mL), 2,000 Units/hr, Intravenous, Continuous, Hunt, Madison H, RPH, Last Rate: 20 mL/hr at 06/06/23 1900, 2,000 Units/hr at 06/06/23 1900   nicotine (NICODERM CQ - dosed in mg/24 hours) patch 14 mg, 14 mg, Transdermal, Daily, Mikey College T, MD, 14 mg at 06/06/23 1610   nicotine polacrilex (NICORETTE) gum 2 mg, 2 mg, Oral, PRN, Marcelino Duster, MD, 2 mg at 06/05/23 2120   ondansetron (ZOFRAN) injection 4 mg, 4 mg, Intravenous, Q6H PRN, Mikey College T, MD, 4 mg at 06/05/23 1046   oxyCODONE (Oxy IR/ROXICODONE) immediate release tablet 5 mg, 5 mg, Oral, Q4H PRN, Mikey College T, MD, 5 mg at 06/06/23 2001   pantoprazole (PROTONIX) injection 40 mg, 40 mg, Intravenous, Q12H, Vanga, Loel Dubonnet, MD,  40 mg at 06/06/23 0953   sucralfate (CARAFATE) 1 GM/10ML suspension 1 g, 1 g, Oral, TID WC & HS, Vanga, Loel Dubonnet, MD, 1 g at 06/06/23 1626   Physical exam:  Vitals:   06/06/23 0930 06/06/23 0948 06/06/23 1355 06/06/23 1601  BP: 117/85 115/85 (!) 127/92 107/75  Pulse: (!) 107 98 (!) 101 96  Resp: 13 16 17 16   Temp: 98.4 F (36.9 C) 98.2 F (36.8 C) 98.2 F (36.8 C) 98.4 F (36.9 C)  TempSrc:  Oral  Oral  SpO2: 98% 100% 100% 100%  Weight:      Height:       Physical Exam Constitutional:      Comments: He is thin and cachectic  Cardiovascular:     Rate and Rhythm: Normal rate and regular rhythm.     Heart sounds: Normal heart sounds.  Pulmonary:     Effort: Pulmonary effort is normal.     Breath sounds: Normal breath sounds.  Abdominal:     General: Bowel sounds are normal.  Palpations: Abdomen is soft.  Skin:    General: Skin is warm and dry.  Neurological:     Mental Status: He is alert and oriented to person, place, and time.           Latest Ref Rng & Units 06/04/2023   12:20 PM  CMP  Glucose 70 - 99 mg/dL 89   BUN 6 - 20 mg/dL 13   Creatinine 6.96 - 1.24 mg/dL 2.95   Sodium 284 - 132 mmol/L 135   Potassium 3.5 - 5.1 mmol/L 4.3   Chloride 98 - 111 mmol/L 98   CO2 22 - 32 mmol/L 25   Calcium 8.9 - 10.3 mg/dL 9.0   Total Protein 6.5 - 8.1 g/dL 6.9   Total Bilirubin 0.0 - 1.2 mg/dL 0.3   Alkaline Phos 38 - 126 U/L 104   AST 15 - 41 U/L 20   ALT 0 - 44 U/L 14       Latest Ref Rng & Units 06/06/2023   12:54 AM  CBC  WBC 4.0 - 10.5 K/uL 8.9   Hemoglobin 13.0 - 17.0 g/dL 44.0   Hematocrit 10.2 - 52.0 % 36.5   Platelets 150 - 400 K/uL 256     @IMAGES @  CT CHEST ABDOMEN PELVIS W CONTRAST Result Date: 06/06/2023 CLINICAL DATA:  Deep venous thrombosis. Concern for metastatic disease. 43 year old male. Esophageal mass on endoscopy. * Tracking Code: BO * EXAM: CT CHEST, ABDOMEN, AND PELVIS WITH CONTRAST TECHNIQUE: Multidetector CT imaging of the chest,  abdomen and pelvis was performed following the standard protocol during bolus administration of intravenous contrast. RADIATION DOSE REDUCTION: This exam was performed according to the departmental dose-optimization program which includes automated exposure control, adjustment of the mA and/or kV according to patient size and/or use of iterative reconstruction technique. CONTRAST:  80mL OMNIPAQUE IOHEXOL 300 MG/ML  SOLN COMPARISON:  None Available. FINDINGS: CT CHEST FINDINGS Cardiovascular: No significant vascular findings. Normal heart size. No pericardial effusion. No PE identified. Mediastinum/Nodes: Thickening through the distal esophagus leading up to the GE junction over approximately 4 cm. Lymph node adjacent to the distal esophagus measures 10 mm on image 45/2. Subcarinal lymph node is also suspicious measuring 12 mm. Lungs/Pleura: No suspicious pulmonary nodules. Normal pleural. Airways normal. Musculoskeletal: No aggressive osseous lesion. CT ABDOMEN AND PELVIS FINDINGS Hepatobiliary: Multiple round hypoenhancing lesions within LEFT RIGHT hepatic lobe. Conglomerate cluster of lesions in the posterior RIGHT hepatic lobe measures 6.75.6 cm on image 58. A discrete lesion in the central LEFT hepatic lobe measures 17 mm on 55/2. There approximately 20 lesions in liver. Gallbladder normal. Pancreas: Pancreas is normal. No ductal dilatation. No pancreatic inflammation. Spleen: Normal spleen Adrenals/urinary tract: Adrenal glands and kidneys are normal. The ureters and bladder normal. Stomach/Bowel: Stomach, small bowel, appendix, and cecum are normal. The colon and rectosigmoid colon are normal. Vascular/Lymphatic: Abdominal aorta is normal caliber. Small nodule in the gastrohepatic ligament and celiac nodal stations are suspicious given the hepatic metastasis. Reproductive: Prostate unremarkable Other: No peritoneal metastasis Musculoskeletal: No aggressive osseous lesion. 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 IMPRESSION: CHEST: 1. 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 2. Suspicious lymph nodes adjacent to the distal esophagus and in the subcarinal nodal station. 3. No pulmonary metastasis. PELVIS: 1. Multiple hypoenhancing lesions in the liver consistent with hepatic metastasis. 2. Probable metastatic adenopathy in the gastrohepatic ligament and celiac nodal stations. 3. No other evidence of metastatic disease in the abdomen pelvis.  Electronically Signed   By: Genevive Bi M.D.   On: 06/06/2023 17:31   US Venous Img Lower Unilateral Left Result Date: 06/04/2023 CLINICAL DATA:  Several day history of left lower extremity pain, swelling, and warmth EXAM: LEFT LOWER EXTREMITY VENOUS DOPPLER ULTRASOUND TECHNIQUE: Gray-scale sonography with compression, as well as color and duplex ultrasound, were performed to evaluate the deep venous system(s) from the level of the common femoral vein through the popliteal and proximal calf veins. COMPARISON:  None Available. FINDINGS: VENOUS Echogenic thrombus is present within calf veins extending to the level of mid femoral vein. The calf veins, popliteal, and distal femoral veins are noncompressible. The proximal femoral vein is patent and compressible, however there is absence of normal respiratory plasticity and response to augmentation. Normal compressibility of the common femoral vein. Visualized portions of profunda femoral vein and great saphenous vein unremarkable. Limited views of the contralateral common femoral vein are unremarkable. OTHER None. Limitations: none IMPRESSION: Acute deep venous thrombosis of the left lower extremity extending from the calf veins to the level of mid femoral vein. These results will be called to the ordering clinician or representative by the Radiologist Assistant, and communication documented in the PACS or [employer]. Electronically Signed   By: Agustin Cree M.D.    On: 06/04/2023 11:52    Assessment and plan- Patient is a 43 y.o. 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  CT chest abdomen pelvis with contrast unfortunately shows liver metastases and therefore patient would not be a candidate for curative intent surgery.  Treatment in this situation would be palliative and not curative and would involve chemotherapy with or without immunotherapy and anti-HER2 based agents.  I will discuss treatment options with him in greater detail once final pathology is back.  CEA is currently pending.  He is agreeable to proceeding with port placement at the same time along with PEG tube  Patient is unable to swallow solids and given how narrow his GE junction opening was I recommended general surgery should see him and get a palliative PEG tube placement for nutrition.  Nutrition should also see him and he should get started on PEG tube feeds as an inpatient as he is at a risk of refeeding syndrome.  I will also get in touch with radiation oncology to discuss palliative radiation with him  Left lower extremity DVT: He should remain on heparin drip until he gets a PEG tube placement and port today.  After he gets a PEG tube he can be transition to Eliquis  Mild normocytic anemia: I will add ferritin and iron studies to labs tomorrow   Thank you for this kind referral and the opportunity to participate in the care of this patient   Visit Diagnosis 1. Acute deep vein thrombosis (DVT) of left lower extremity, unspecified vein (HCC)     Dr. Owens Shark, MD, MPH [facility] at [facility] [phone] 06/06/2023

## 2023-06-06 NOTE — Op Note (Signed)
 [facility] Gastroenterology Patient Name: Tyler Stanley Procedure Date: 06/06/2023 6:57 AM MRN: [id number] Account #: [id number] Date of Birth: January 31, 1981 Admit Type: Inpatient Age: 43 Room: [facility] ENDO ROOM 4 Gender: Male Note Status: Finalized Instrument Name: Upper Endoscope [id number] Procedure:             Upper GI endoscopy Indications:           Esophageal dysphagia, Nausea with vomiting, Weight loss Providers:             Toney Reil MD, MD Referring MD:          [facility] Medicines:             General Anesthesia Complications:         No immediate complications. Estimated blood loss:                         Minimal. Procedure:             Pre-Anesthesia Assessment:                        - Prior to the procedure, a History and Physical was                         performed, and patient medications and allergies were                         reviewed. The patient is competent. The risks and                         benefits of the procedure and the sedation options and                         risks were discussed with the patient. All questions                         were answered and informed consent was obtained.                         Patient identification and proposed procedure were                         verified by the physician, the nurse, the                         anesthesiologist, the anesthetist and the technician                         in the pre-procedure area in the procedure room in the                         endoscopy suite. Mental Status Examination: alert and                         oriented. Airway Examination: normal oropharyngeal                         airway and neck mobility. Respiratory Examination:  clear to auscultation. CV Examination: normal.                         Prophylactic Antibiotics: The patient does not require                         prophylactic antibiotics. Prior Anticoagulants:  The                         patient has taken heparin, last dose was day of                         procedure. ASA Grade Assessment: III - A patient with                         severe systemic disease. After reviewing the risks and                         benefits, the patient was deemed in satisfactory                         condition to undergo the procedure. The anesthesia                         plan was to use general anesthesia. Immediately prior                         to administration of medications, the patient was                         re-assessed for adequacy to receive sedatives. The                         heart rate, respiratory rate, oxygen saturations,                         blood pressure, adequacy of pulmonary ventilation, and                         response to care were monitored throughout the                         procedure. The physical status of the patient was                         re-assessed after the procedure.                        After obtaining informed consent, the endoscope was                         passed under direct vision. Throughout the procedure,                         the patient's blood pressure, pulse, and oxygen                         saturations were monitored continuously. The Endoscope  was introduced through the mouth, and advanced to the                         second part of duodenum. The upper GI endoscopy was                         unusually difficult due to a partially obstructing                         mass. Successful completion of the procedure was aided                         by withdrawing the scope and replacing with the                         UltraSlim scope. The patient tolerated the procedure                         well. Findings:      A large, submucosal, friable and ulcerating mass with bleeding and       stigmata of recent bleeding was found in the distal esophagus and at the        gastroesophageal junction, 40 cm from the incisors, measuring       approximately 2cm in length. The mass was partially obstructing and       circumferential. Biopsies were taken with a cold forceps for histology.       Estimated blood loss was minimal. Adult endoscope could not be       traversed, switched to ultraslim scope to complete the exam. The mass       was visible at the GE junction on retroflexed view in stomach      Esophagogastric landmarks were identified: the gastroesophageal junction       was found at 40 cm from the incisors.      The proximal esophagus and mid esophagus were normal. Biopsies were       taken with a cold forceps for histology.      The entire examined stomach was normal. Biopsies were taken with a cold       forceps for histology.      The duodenal bulb and second portion of the duodenum were normal. Impression:            - Partially obstructing, likely malignant esophageal                         tumor was found at the gastroesophageal junction.                         Biopsied.                        - Esophagogastric landmarks identified.                        - Normal proximal esophagus and mid esophagus.                         Biopsied.                        -  Normal stomach. Biopsied.                        - Normal duodenal bulb and second portion of the                         duodenum. Recommendation:        - Await pathology results.                        - Return patient to hospital ward for ongoing care.                        - Full liquid diet indefinitely.                        - Continue present medications.                        - Consult oncology and further imaging Procedure Code(s):     --- Professional ---                        [id number], Esophagogastroduodenoscopy, flexible,                         transoral; with biopsy, single or multiple Diagnosis Code(s):     --- Professional ---                        D49.0, Neoplasm of  unspecified behavior of digestive                         system                        R13.14, Dysphagia, pharyngoesophageal phase                        R11.2, Nausea with vomiting, unspecified                        R63.4, Abnormal weight loss CPT copyright 2022 American Medical Association. All rights reserved. The codes documented in this report are preliminary and upon coder review may  be revised to meet current compliance requirements. Dr. Libby Maw Toney Reil MD, MD 06/06/2023 8:58:02 AM This report has been signed electronically. Number of Addenda: 0 Note Initiated On: 06/06/2023 6:57 AM Estimated Blood Loss:  Estimated blood loss was minimal.      [facility]

## 2023-06-06 NOTE — Anesthesia Preprocedure Evaluation (Addendum)
 Anesthesia Evaluation  Patient identified by MRN, date of birth, ID band Patient awake    Reviewed: Allergy & Precautions, NPO status , Patient's Chart, lab work & pertinent test results  History of Anesthesia Complications Negative for: history of anesthetic complications  Airway Mallampati: III   Neck ROM: Full    Dental  (+) Missing, Loose, Poor Dentition   Pulmonary Current Smoker (1/2 ppd) and Patient abstained from smoking.   Pulmonary exam normal breath sounds clear to auscultation       Cardiovascular negative cardio ROS Normal cardiovascular exam Rhythm:Regular Rate:Normal     Neuro/Psych  PSYCHIATRIC DISORDERS (ADHD)      Marijuana use, qOD    GI/Hepatic ,GERD  ,,  Endo/Other  negative endocrine ROS    Renal/GU negative Renal ROS     Musculoskeletal   Abdominal   Peds  Hematology Acute LE DVT this admission, on heparin infusion   Anesthesia Other Findings   Reproductive/Obstetrics                             Anesthesia Physical Anesthesia Plan  ASA: 3  Anesthesia Plan: General   Post-op Pain Management:    Induction: Intravenous  PONV Risk Score and Plan: 1 and Propofol infusion, TIVA and Treatment may vary due to age or medical condition  Airway Management Planned: Natural Airway  Additional Equipment:   Intra-op Plan:   Post-operative Plan:   Informed Consent: I have reviewed the patients History and Physical, chart, labs and discussed the procedure including the risks, benefits and alternatives for the proposed anesthesia with the patient or authorized representative who has indicated his/her understanding and acceptance.       Plan Discussed with: CRNA  Anesthesia Plan Comments: (LMA/GETA backup discussed.  Patient consented for risks of anesthesia including but not limited to:  - adverse reactions to medications - damage to eyes, teeth, lips or other  oral mucosa - nerve damage due to positioning  - sore throat or hoarseness - damage to heart, brain, nerves, lungs, other parts of body or loss of life  Informed patient about role of CRNA in peri- and intra-operative care.  Patient voiced understanding.)       Anesthesia Quick Evaluation

## 2023-06-06 NOTE — Progress Notes (Signed)
 EGD postprocedure note  EGD revealed GE junction/distal esophagus stricturing mass highly concerning for malignancy, unable to pass the adult endoscope Biopsies performed.  Switched to ultraslim scope to complete rest of the exam.  Normal stomach and duodenum  Recommendations Discussed endoscopy findings with patient and his mother in the room Await pathology results Resume heparin and transition to oral anticoagulation Strict liquid diet only PPI twice daily Liquid Carafate as needed 3 times daily Recommend oncology consult and follow-up as outpatient If gastrostomy tube is indicated, recommend surgical placement Both patient and his mom expressed understanding of my recommendations GI will sign off at this time, please call us back with questions or concerns  Lannette Donath, MD

## 2023-06-07 ENCOUNTER — Other Ambulatory Visit: Payer: Self-pay | Admitting: General Surgery

## 2023-06-07 ENCOUNTER — Encounter: Payer: Self-pay | Admitting: Gastroenterology

## 2023-06-07 ENCOUNTER — Inpatient Hospital Stay: Payer: 59 | Admitting: Anesthesiology

## 2023-06-07 ENCOUNTER — Other Ambulatory Visit: Payer: Self-pay

## 2023-06-07 ENCOUNTER — Inpatient Hospital Stay: Payer: 59

## 2023-06-07 ENCOUNTER — Encounter
Admission: EM | Disposition: A | Discharge status: Home / Self Care | Payer: Self-pay | Source: Home / Self Care | Attending: Internal Medicine

## 2023-06-07 DIAGNOSIS — R1312 Dysphagia, oropharyngeal phase: Secondary | ICD-10-CM | POA: Diagnosis not present

## 2023-06-07 DIAGNOSIS — C799 Secondary malignant neoplasm of unspecified site: Secondary | ICD-10-CM

## 2023-06-07 DIAGNOSIS — F1290 Cannabis use, unspecified, uncomplicated: Secondary | ICD-10-CM | POA: Diagnosis not present

## 2023-06-07 DIAGNOSIS — R222 Localized swelling, mass and lump, trunk: Secondary | ICD-10-CM | POA: Diagnosis not present

## 2023-06-07 DIAGNOSIS — I824Z2 Acute embolism and thrombosis of unspecified deep veins of left distal lower extremity: Secondary | ICD-10-CM | POA: Diagnosis not present

## 2023-06-07 DIAGNOSIS — R112 Nausea with vomiting, unspecified: Secondary | ICD-10-CM

## 2023-06-07 DIAGNOSIS — I82402 Acute embolism and thrombosis of unspecified deep veins of left lower extremity: Secondary | ICD-10-CM | POA: Diagnosis not present

## 2023-06-07 DIAGNOSIS — K222 Esophageal obstruction: Secondary | ICD-10-CM | POA: Diagnosis not present

## 2023-06-07 DIAGNOSIS — F17200 Nicotine dependence, unspecified, uncomplicated: Secondary | ICD-10-CM | POA: Diagnosis not present

## 2023-06-07 HISTORY — PX: PORTACATH PLACEMENT: SHX2246

## 2023-06-07 HISTORY — PX: GASTROSTOMY: SHX5249

## 2023-06-07 LAB — CBC
HCT: 37.8 % — ABNORMAL LOW (ref 39.0–52.0)
Hemoglobin: 12.6 g/dL — ABNORMAL LOW (ref 13.0–17.0)
MCH: 29.1 pg (ref 26.0–34.0)
MCHC: 33.3 g/dL (ref 30.0–36.0)
MCV: 87.3 fL (ref 80.0–100.0)
Platelets: 240 10*3/uL (ref 150–400)
RBC: 4.33 MIL/uL (ref 4.22–5.81)
RDW: 11.9 % (ref 11.5–15.5)
WBC: 8.8 10*3/uL (ref 4.0–10.5)
nRBC: 0 % (ref 0.0–0.2)

## 2023-06-07 LAB — BASIC METABOLIC PANEL
Anion gap: 10 (ref 5–15)
BUN: 15 mg/dL (ref 6–20)
CO2: 27 mmol/L (ref 22–32)
Calcium: 9.3 mg/dL (ref 8.9–10.3)
Chloride: 100 mmol/L (ref 98–111)
Creatinine, Ser: 0.9 mg/dL (ref 0.61–1.24)
GFR, Estimated: >60 mL/min (ref >60)
Glucose, Bld: 99 mg/dL (ref 70–99)
Potassium: 4.3 mmol/L (ref 3.5–5.1)
Sodium: 137 mmol/L (ref 135–145)

## 2023-06-07 LAB — CEA: CEA: 60.8 ng/mL — ABNORMAL HIGH (ref 0.0–4.7)

## 2023-06-07 LAB — HEPARIN LEVEL (UNFRACTIONATED)
Heparin Unfractionated: 0.36 [IU]/mL (ref 0.30–0.70)
Heparin Unfractionated: 0.18 [IU]/mL — ABNORMAL LOW (ref 0.30–0.70)

## 2023-06-07 SURGERY — INSERTION, TUNNELED CENTRAL VENOUS DEVICE, WITH PORT
Anesthesia: General | Laterality: Right

## 2023-06-07 MED ORDER — ONDANSETRON HCL 4 MG/2ML IJ SOLN
INTRAMUSCULAR | Status: AC
  Filled 2023-06-07: qty 2

## 2023-06-07 MED ORDER — KETAMINE HCL 50 MG/5ML IJ SOSY
PREFILLED_SYRINGE | INTRAMUSCULAR | Status: AC
  Filled 2023-06-07: qty 5

## 2023-06-07 MED ORDER — FENTANYL CITRATE (PF) 100 MCG/2ML IJ SOLN
INTRAMUSCULAR | Status: DC | PRN
  Administered 2023-06-07 (×2): 25 ug via INTRAVENOUS
  Administered 2023-06-07: 50 ug via INTRAVENOUS

## 2023-06-07 MED ORDER — PROPOFOL 10 MG/ML IV BOLUS
INTRAVENOUS | Status: DC | PRN
  Administered 2023-06-07: 50 mg via INTRAVENOUS
  Administered 2023-06-07: 150 mg via INTRAVENOUS

## 2023-06-07 MED ORDER — MORPHINE SULFATE (PF) 4 MG/ML IV SOLN
4.0000 mg | INTRAVENOUS | Status: AC | PRN
  Administered 2023-06-07 – 2023-06-08 (×3): 4 mg via INTRAVENOUS
  Filled 2023-06-07 (×3): qty 1

## 2023-06-07 MED ORDER — DEXAMETHASONE SODIUM PHOSPHATE 10 MG/ML IJ SOLN
INTRAMUSCULAR | Status: DC | PRN
  Administered 2023-06-07: 10 mg via INTRAVENOUS

## 2023-06-07 MED ORDER — SODIUM CHLORIDE 0.9 % IV SOLN
INTRAVENOUS | Status: DC | PRN
  Administered 2023-06-07: 9 mL via INTRAMUSCULAR

## 2023-06-07 MED ORDER — OXYCODONE HCL 5 MG/5ML PO SOLN: 5.0000 mg | Freq: Once | ORAL | Status: DC | PRN

## 2023-06-07 MED ORDER — VASOPRESSIN 20 UNIT/ML IV SOLN
INTRAVENOUS | Status: DC | PRN
  Administered 2023-06-07 (×3): 1 [IU] via INTRAVENOUS

## 2023-06-07 MED ORDER — OXYCODONE HCL 5 MG PO TABS: 5.0000 mg | ORAL_TABLET | Freq: Once | ORAL | Status: DC | PRN

## 2023-06-07 MED ORDER — HEPARIN (PORCINE) 25000 UT/250ML-% IV SOLN
2150.0000 [IU]/h | INTRAVENOUS | Status: DC
  Administered 2023-06-07: 2000 [IU]/h via INTRAVENOUS
  Administered 2023-06-08: 2150 [IU]/h via INTRAVENOUS
  Filled 2023-06-07: qty 250

## 2023-06-07 MED ORDER — LIDOCAINE HCL (PF) 2 % IJ SOLN
INTRAMUSCULAR | Status: AC
  Filled 2023-06-07: qty 5

## 2023-06-07 MED ORDER — BUPIVACAINE-EPINEPHRINE (PF) 0.25% -1:200000 IJ SOLN
INTRAMUSCULAR | Status: DC | PRN
  Administered 2023-06-07: 12 mL

## 2023-06-07 MED ORDER — VASOPRESSIN 20 UNIT/ML IV SOLN
INTRAVENOUS | Status: AC
  Filled 2023-06-07: qty 1

## 2023-06-07 MED ORDER — PHENYLEPHRINE 80 MCG/ML (10ML) SYRINGE FOR IV PUSH (FOR BLOOD PRESSURE SUPPORT)
PREFILLED_SYRINGE | INTRAVENOUS | Status: DC | PRN
  Administered 2023-06-07: 160 ug via INTRAVENOUS
  Administered 2023-06-07: 80 ug via INTRAVENOUS
  Administered 2023-06-07: 160 ug via INTRAVENOUS
  Administered 2023-06-07: 80 ug via INTRAVENOUS
  Administered 2023-06-07 (×2): 160 ug via INTRAVENOUS

## 2023-06-07 MED ORDER — SUCCINYLCHOLINE CHLORIDE 200 MG/10ML IV SOSY
PREFILLED_SYRINGE | INTRAVENOUS | Status: AC
  Filled 2023-06-07: qty 10

## 2023-06-07 MED ORDER — LACTATED RINGERS IV SOLN: INTRAVENOUS | Status: DC | PRN

## 2023-06-07 MED ORDER — SODIUM CHLORIDE (PF) 0.9 % IJ SOLN
INTRAMUSCULAR | Status: DC | PRN
  Administered 2023-06-07: 10 mL

## 2023-06-07 MED ORDER — SUCCINYLCHOLINE CHLORIDE 200 MG/10ML IV SOSY
PREFILLED_SYRINGE | INTRAVENOUS | Status: DC | PRN
  Administered 2023-06-07: 100 mg via INTRAVENOUS

## 2023-06-07 MED ORDER — FENTANYL CITRATE (PF) 100 MCG/2ML IJ SOLN
25.0000 ug | INTRAMUSCULAR | Status: DC | PRN
  Administered 2023-06-07: 25 ug via INTRAVENOUS

## 2023-06-07 MED ORDER — CEFAZOLIN SODIUM-DEXTROSE 2-4 GM/100ML-% IV SOLN
INTRAVENOUS | Status: AC
  Filled 2023-06-07: qty 100

## 2023-06-07 MED ORDER — LIDOCAINE HCL (CARDIAC) PF 100 MG/5ML IV SOSY
PREFILLED_SYRINGE | INTRAVENOUS | Status: DC | PRN
Start: 2023-06-07 — End: 2023-06-07
  Administered 2023-06-07: 80 mg via INTRAVENOUS

## 2023-06-07 MED ORDER — ONDANSETRON HCL 4 MG/2ML IJ SOLN
INTRAMUSCULAR | Status: DC | PRN
  Administered 2023-06-07: 4 mg via INTRAVENOUS

## 2023-06-07 MED ORDER — DEXAMETHASONE SODIUM PHOSPHATE 10 MG/ML IJ SOLN
INTRAMUSCULAR | Status: AC
  Filled 2023-06-07: qty 1

## 2023-06-07 MED ORDER — HEPARIN SODIUM (PORCINE) 5000 UNIT/ML IJ SOLN
INTRAMUSCULAR | Status: AC
  Filled 2023-06-07: qty 1

## 2023-06-07 MED ORDER — ACETAMINOPHEN 10 MG/ML IV SOLN
INTRAVENOUS | Status: AC
  Filled 2023-06-07: qty 100

## 2023-06-07 MED ORDER — MIDAZOLAM HCL 2 MG/2ML IJ SOLN
INTRAMUSCULAR | Status: DC | PRN
  Administered 2023-06-07: 2 mg via INTRAVENOUS

## 2023-06-07 MED ORDER — FENTANYL CITRATE (PF) 100 MCG/2ML IJ SOLN
INTRAMUSCULAR | Status: AC
  Filled 2023-06-07: qty 2

## 2023-06-07 MED ORDER — PHENYLEPHRINE 80 MCG/ML (10ML) SYRINGE FOR IV PUSH (FOR BLOOD PRESSURE SUPPORT)
PREFILLED_SYRINGE | INTRAVENOUS | Status: AC
  Filled 2023-06-07: qty 10

## 2023-06-07 MED ORDER — BUPIVACAINE-EPINEPHRINE (PF) 0.5% -1:200000 IJ SOLN
INTRAMUSCULAR | Status: AC
  Filled 2023-06-07: qty 30

## 2023-06-07 MED ORDER — ACETAMINOPHEN 10 MG/ML IV SOLN
INTRAVENOUS | Status: DC | PRN
  Administered 2023-06-07: 1000 mg via INTRAVENOUS

## 2023-06-07 MED ORDER — MIDAZOLAM HCL 2 MG/2ML IJ SOLN
INTRAMUSCULAR | Status: AC
  Filled 2023-06-07: qty 2

## 2023-06-07 MED ORDER — CEFAZOLIN SODIUM-DEXTROSE 2-3 GM-%(50ML) IV SOLR
INTRAVENOUS | Status: DC | PRN
  Administered 2023-06-07: 2 g via INTRAVENOUS

## 2023-06-07 SURGICAL SUPPLY — 57 items
BAG DECANTER FOR FLEXI CONT (MISCELLANEOUS) ×2 IMPLANT
BLADE SURG 15 STRL LF DISP TIS (BLADE) ×2 IMPLANT
BLADE SURG SZ11 CARB STEEL (BLADE) ×2 IMPLANT
CHLORAPREP W/TINT 26 (MISCELLANEOUS) IMPLANT
CLAMP SUTURE YELLOW 5 PAIRS (MISCELLANEOUS) ×2 IMPLANT
DERMABOND ADVANCED .7 DNX12 (GAUZE/BANDAGES/DRESSINGS) ×2 IMPLANT
DRAPE C-ARM XRAY 36X54 (DRAPES) ×2 IMPLANT
DRAPE LAPAROTOMY 100X77 ABD (DRAPES) ×2 IMPLANT
DRSG OPSITE POSTOP 3X4 (GAUZE/BANDAGES/DRESSINGS) IMPLANT
ELECT REM PT RETURN 9FT ADLT (ELECTROSURGICAL) ×2 IMPLANT
ELECTRODE REM PT RTRN 9FT ADLT (ELECTROSURGICAL) ×2 IMPLANT
G-TUBE MIC 18FR ENFIT ADLT (TUBING) IMPLANT
G-TUBE MIC 22FR ENFIT ADLT (TUBING) IMPLANT
G-TUBE MIC ADLT 16FR ENFIT (TUBING) IMPLANT
GAUZE 4X4 16PLY ~~LOC~~+RFID DBL (SPONGE) IMPLANT
GAUZE SPONGE 4X4 12PLY STRL (GAUZE/BANDAGES/DRESSINGS) ×2 IMPLANT
GLOVE BIO SURGEON STRL SZ 6.5 (GLOVE) ×2 IMPLANT
GLOVE BIOGEL PI IND STRL 6.5 (GLOVE) ×2 IMPLANT
GLOVE BIOGEL PI IND STRL 7.0 (GLOVE) ×2 IMPLANT
GLOVE SURG SYN 6.5 ES PF (GLOVE) ×4 IMPLANT
GLOVE SURG SYN 6.5 PF PI (GLOVE) ×2 IMPLANT
GOWN STRL REUS W/ TWL LRG LVL3 (GOWN DISPOSABLE) ×6 IMPLANT
IV NS 500ML BAXH (IV SOLUTION) ×2 IMPLANT
KIT PORT INFUSION SMART 8FR (Port) ×2 IMPLANT
KIT TURNOVER KIT A (KITS) ×2 IMPLANT
LABEL OR SOLS (LABEL) ×2 IMPLANT
MANIFOLD NEPTUNE II (INSTRUMENTS) ×2 IMPLANT
NDL FILTER BLUNT 18X1 1/2 (NEEDLE) ×2 IMPLANT
NDL HYPO 22X1.5 SAFETY MO (MISCELLANEOUS) ×2 IMPLANT
NEEDLE FILTER BLUNT 18X1 1/2 (NEEDLE) ×4 IMPLANT
NEEDLE HYPO 22X1.5 SAFETY MO (MISCELLANEOUS) ×2 IMPLANT
NS IRRIG 1000ML POUR BTL (IV SOLUTION) ×2 IMPLANT
PACK BASIN MAJOR ARMC (MISCELLANEOUS) ×2 IMPLANT
PACK PORT-A-CATH (MISCELLANEOUS) ×2 IMPLANT
SPONGE DRAIN TRACH 4X4 STRL 2S (GAUZE/BANDAGES/DRESSINGS) ×2 IMPLANT
SPONGE T-LAP 18X18 ~~LOC~~+RFID (SPONGE) ×2 IMPLANT
SUT ETHILON 2 0 FS 18 (SUTURE) IMPLANT
SUT MNCRL 4-0 27 PS-2 XMFL (SUTURE) ×4 IMPLANT
SUT MNCRL AB 4-0 PS2 18 (SUTURE) ×2 IMPLANT
SUT PDS AB 1 CT1 36 (SUTURE) ×4 IMPLANT
SUT PDS PLUS AB 0 CT-2 (SUTURE) IMPLANT
SUT PROLENE 2 0 FS (SUTURE) ×2 IMPLANT
SUT SILK 2 0 SH (SUTURE) IMPLANT
SUT SILK 3 0 SH CR/8 (SUTURE) IMPLANT
SUT VIC AB 2-0 SH 27XBRD (SUTURE) ×2 IMPLANT
SUT VIC AB 3-0 SH 27X BRD (SUTURE) ×2 IMPLANT
SUTURE MNCRL 4-0 27XMF (SUTURE) IMPLANT
SYR 10ML LL (SYRINGE) ×4 IMPLANT
SYR 20ML LL LF (SYRINGE) ×2 IMPLANT
SYR 3ML LL SCALE MARK (SYRINGE) ×2 IMPLANT
TAG SUTURE CLAMP YLW 5PR (MISCELLANEOUS) IMPLANT
TRAP FLUID SMOKE EVACUATOR (MISCELLANEOUS) ×2 IMPLANT
TUBE GASTRO 18FR ENFIT (TUBING) IMPLANT
TUBE GASTRO 22FR ENFIT (TUBING) ×2 IMPLANT
TUBE GSTRM 1 16FR INTNL ENFIT (TUBING) IMPLANT
TUBE GSTRM 10.5X14FR YPRT (TUBING) IMPLANT
WATER STERILE IRR 500ML POUR (IV SOLUTION) ×2 IMPLANT

## 2023-06-07 NOTE — Progress Notes (Signed)
 Called to bedside by PACU RN to assess patient for complaints of shortness of breath and sweating.  Patient appears diaphoretic, with a disposition suggestive of some sort of emergence delirium - his arms are up above his head, and his face has a blank expression. He is able to answer questions. He has subjective shortness of breath, but no visible respiratory distress, and SpO2=100%, mild sinus tachycardia. No muscular rigidity on physical exam. Patient complains of pain around the G-tube site.  Standard post-operative CXR already done, pending read, though low concern for serious issues like pneumothorax. 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 Will reassess as needed.

## 2023-06-07 NOTE — Plan of Care (Signed)

## 2023-06-07 NOTE — Op Note (Addendum)
 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   Postoperative diagnosis: Same.   Procedure: Stamm gastrostomy.                     Liver biopsy (wedge)  Anesthesia: GETA  Surgeon: Dr. Hazle Quant, MD  Wound Classification: Clean Contaminated  Indications: Patient is a 43 y.o. male required prolonged enteral nutrition because of obstructive esophageal malignancy. Stamm gastrostomy was chosen as the route of nutritional support.   Findings: 1. Gastrostomy 22 Fr in place 2. Normal gastric anatomy 3.  Abundant amount of liver lesions palpated concerning for metastatic disease 4. Adequate hemostasis  Description of procedure: The patient was placed in the supine position and general endotracheal anesthesia was induced. A time-out was completed verifying correct patient, procedure, site, positioning, and implant(s) and/or special equipment prior to beginning this procedure. Preoperative antibiotics were given. The abdomen was prepped and draped in the usual sterile fashion. A short upper midline incision was made and deepened through the subcutaneous tissues with electrocautery. Hemostasis was assured. The linea alba was incised and the peritoneal cavity entered.  A wound amount of liver lesions were palpated.  With electrocautery, a wedge biopsy of liver lesion was performed.  This was sent for pathology. The stomach was identified and a location on the anterior wall near the greater curvature was selected. That site was approximated to the chosen exit site and found to reach without tension. A small incision was made and the 22-French Gastrostome was passed through the anterior abdominal wall and into the field.  A purse-string suture of 2-0 silk was placed on the anterior surface of the stomach and a gastrotomy was made with electrocautery in the center of the suture. The catheter was inserted into the lumen of the stomach. The  purse-string suture was secured in place in such a manner as to inkwell the stomach around the catheter. A second, outer concentric pursestring was placed in a similar manner and tied to further inkwell the stomach.  The stomach was then tacked to the anterior abdominal wall at the catheter entrance site with several with the same silk sutures used for the purse-string in such a manner as to prevent leakage or torsion. The catheter was secured to the skin with 2-0 Prolene.  Hemostasis was checked and omentum was brought adjacent to the surgical field. The fascia was closed with a running suture of 0-PDS.  The skin was closed with subcuticular sutures of Monocryl 3-0.   The patient tolerated the procedure well and was taken to the postanesthesia care unit in stable condition  Specimen: None  Complications: None  EBL: 5 mL

## 2023-06-07 NOTE — Anesthesia Preprocedure Evaluation (Signed)
 Anesthesia Evaluation  Patient identified by MRN, date of birth, ID band Patient awake    Reviewed: Allergy & Precautions, NPO status , Patient's Chart, lab work & pertinent test results  History of Anesthesia Complications Negative for: history of anesthetic complications  Airway Mallampati: III  TM Distance: >3 FB Neck ROM: Full    Dental  (+) Missing, Loose, Poor Dentition   Pulmonary neg pulmonary ROS, Current Smoker and Patient abstained from smoking.   Pulmonary exam normal breath sounds clear to auscultation       Cardiovascular + DVT  negative cardio ROS Normal cardiovascular exam Rhythm:Regular Rate:Normal     Neuro/Psych  PSYCHIATRIC DISORDERS (ADHD)      Marijuana use, qOD negative neurological ROS  negative psych ROS   GI/Hepatic negative GI ROS, Neg liver ROS,GERD  Medicated and Controlled,,  Endo/Other  negative endocrine ROS    Renal/GU      Musculoskeletal   Abdominal   Peds  Hematology negative hematology ROS (+) Acute LE DVT this admission, on heparin infusion   Anesthesia Other Findings Patient with a PMH of a DVT in his left leg that started last week. Patient was on a heparin infusion that was turned off this morning. Patient denies any respiratory issues. Discussed risks of PE with the patient. Patient also with a loose front incisor on the bottom. Risks of damage to his teeth, gums, and tongue explained including possibly losing his tooth. Patient stated that he was in the process of getting dentures and wanted his tooth out anyways. Explained that I wouldn't remove his tooth and would try to stay off of it, but that there was a risk of damaging it. Patient stated he was aware and willing to proceed.   Past Medical History: No date: ADHD  Past Surgical History: 06/06/2023: BIOPSY     Comment:  Procedure: BIOPSY;  Surgeon: Toney Reil, MD;                Location: [facility] ENDOSCOPY;   Service: Gastroenterology;; 06/06/2023: ESOPHAGOGASTRODUODENOSCOPY (EGD) WITH PROPOFOL; N/A     Comment:  Procedure: ESOPHAGOGASTRODUODENOSCOPY (EGD) WITH               PROPOFOL;  Surgeon: Toney Reil, MD;  Location:               [facility] ENDOSCOPY;  Service: Gastroenterology;  Laterality:               N/A;  BMI    Body Mass Index: 21.02 kg/m      Reproductive/Obstetrics negative OB ROS                             Anesthesia Physical Anesthesia Plan  ASA: 3  Anesthesia Plan: General ETT   Post-op Pain Management:    Induction: Intravenous  PONV Risk Score and Plan: 2 and Ondansetron, Dexamethasone and Midazolam  Airway Management Planned: Oral ETT  Additional Equipment:   Intra-op Plan:   Post-operative Plan: Extubation in OR  Informed Consent: I have reviewed the patients History and Physical, chart, labs and discussed the procedure including the risks, benefits and alternatives for the proposed anesthesia with the patient or authorized representative who has indicated his/her understanding and acceptance.     Dental Advisory Given  Plan Discussed with: Anesthesiologist, CRNA and Surgeon  Anesthesia Plan Comments: (Patient consented for risks of anesthesia including but not limited to:  - adverse reactions to medications -  damage to eyes, teeth, lips or other oral mucosa - nerve damage due to positioning  - sore throat or hoarseness - Damage to heart, brain, nerves, lungs, other parts of body or loss of life  Patient voiced understanding and assent.)       Anesthesia Quick Evaluation

## 2023-06-07 NOTE — Progress Notes (Signed)
 Initial Nutrition Assessment  DOCUMENTATION CODES:   Not applicable  INTERVENTION:   -Once able, start TF via g-tube:   Initiate Osmolite 1.5 @ 20 ml/hr and increase by 10 ml every 8 hours to goal rate of 60 ml/hr.   60 ml Prosource TF daily  30 ml free water flush every 4 hours  Tube feeding regimen provides 2240 kcal (100% of needs), 110 grams of protein, and 1097 ml of H2O.  Total free water: 1277 ml daily  -Monitor Mg, K, and Phos and replete as needed secondary to high refeeding risk -MVI with minerals daily -100 mg thiamine daily x 7 days  NUTRITION DIAGNOSIS:   Increased nutrient needs related to chronic illness (suspected metastatic esophageal cancer) as evidenced by estimated needs.  GOAL:   Patient will meet greater than or equal to 90% of their needs  MONITOR:   PO intake, TF tolerance  REASON FOR ASSESSMENT:   New TF    ASSESSMENT:   Pt with medical history significant of ADHD, scented with worsening of left calf pain.  Pt admitted with DVT and dysphagia.   2/23- s/p EGD- revealed A large, submucosal, friable and ulcerating mass with bleeding and stigmata of recent bleeding was found in the distal esophagus and at the gastroesophageal junction (partially obstructing and circumferential)- biopsies taken 2/24- s/p Stamm gastrostomy.   Reviewed I/O's: -1.5 L x 24 hours and +245 ml since admission  UOP: 1.7 L x 24 hours  Case discussed with SLP; plan for PEG placement today. Per general surgery, no plans to start TF until tomorrow.   Per H&P, pt with episodes of nausea and vomiting after eating and drink over the past 5-6 weeks. He also has occasionally felt the sensation of food being stuck in the throat. He estimated that he has lost about 15 pounds over the p[ast few months.   Pt unavailable at time of visit. Pt off unit for g-tube placement. RD unable to obtain further nutrition-related history or complete nutrition-focused physical exam at this  time.    Per oncology notes, biopsies still pending.Pt with GE junction mass and liver metastasis concerning for metastatic esophageal cancer. Pt is not a surgical candidate. Treatment would likely be palliative chemotherapy.   Reviewed wt hx; no wt loss noted over the past several years.   Palliative care consult pending for goals of care discussions.   Medications reviewed and include protonix.   Labs reviewed.    Diet Order:   Diet Order             Diet NPO time specified  Diet effective now                   EDUCATION NEEDS:   No education needs have been identified at this time  Skin:  Skin Assessment: Skin Integrity Issues: Skin Integrity Issues:: Incisions Incisions: clsoed chest, abdomen s/p surgical g-tube placement  Last BM:  05/30/23  Height:   Ht Readings from Last 1 Encounters:  06/07/23 6' (1.829 m)    Weight:   Wt Readings from Last 1 Encounters:  06/07/23 70.3 kg    Ideal Body Weight:  80.9 kg  BMI:  Body mass index is 21.02 kg/m.  Estimated Nutritional Needs:   Kcal:  2100-2300  Protein:  105-120 grams  Fluid:  > 2 L    Levada Schilling, RD, LDN, CDCES Registered Dietitian III Certified Diabetes Care and Education Specialist If unable to reach this RD, please use "RD Inpatient"  group chat on secure chat between hours of 8am-4 pm daily

## 2023-06-07 NOTE — Consult Note (Signed)
 SURGICAL CONSULTATION NOTE   HISTORY OF PRESENT ILLNESS (HPI):  43 y.o. male presented to [facility] ED for evaluation of swelling of the lower extremity.  Having progressing swelling of the lower extremity for the last few weeks.  He endorses that he was starting having pain in the lower extremity.  No radiation.  Patient cannot identify any alleviating or aggravating factors.  He also also been complaining off chronic nausea and vomiting.  Denies any chest pain.  Denies any current out of breath.  At the ED he was found with swelling of the lower extremity, mainly on the left.  Otherwise unremarkable physical exam.  No abdominal distention.  Hemoglobin 12.6.  No electrolyte disturbance.  Lower extremity ultrasound shows DVT of the left lower extremity.  I personally evaluated the images.  Due to the nausea and vomiting he had an upper endoscopy that shows a partially obstructive mass of the distal esophagus concerning for malignancy.  Biopsy was taken.  Biopsy are pending.  CT scan of the chest abdomen and pelvis was done for staging.  This shows multiple liver masses consistent with hepatic metastasis.  Probable metastatic adenopathy around the stomach and celiac nodes.  I personally evaluated the images.  Patient not able to tolerate solid diet due to the dysphagia.  Medical oncology evaluated patient and recommended palliative PEG and insertion of Port-A-Cath for palliative chemotherapy.  Surgery is consulted by Dr. Smith Robert in this context for evaluation and management of stage IV esophageal cancer.  PAST MEDICAL HISTORY (PMH):  Past Medical History:  Diagnosis Date   ADHD      PAST SURGICAL HISTORY (PSH):  Past Surgical History:  Procedure Laterality Date   BIOPSY  06/06/2023   Procedure: BIOPSY;  Surgeon: Toney Reil, MD;  Location: [facility] ENDOSCOPY;  Service: Gastroenterology;;   ESOPHAGOGASTRODUODENOSCOPY (EGD) WITH PROPOFOL N/A 06/06/2023   Procedure: ESOPHAGOGASTRODUODENOSCOPY (EGD)  WITH PROPOFOL;  Surgeon: Toney Reil, MD;  Location: [facility] ENDOSCOPY;  Service: Gastroenterology;  Laterality: N/A;     MEDICATIONS:  Prior to Admission medications   Medication Sig Start Date End Date Taking? Authorizing Provider  omeprazole (PRILOSEC) 20 MG capsule Take 20 mg by mouth daily. 06/02/23  Yes [provider]  ibuprofen (ADVIL) 800 MG tablet Take 1 tablet (800 mg total) by mouth every 8 (eight) hours as needed for moderate pain. Patient not taking: Reported on 06/04/2023 09/18/20   Shaune Pollack, MD     ALLERGIES:  No Known Allergies   SOCIAL HISTORY:  Social History   Socioeconomic History   Marital status: Single    Spouse name: Not on file   Number of children: Not on file   Years of education: Not on file   Highest education level: Not on file  Occupational History   Not on file  Tobacco Use   Smoking status: Every Day    Current packs/day: 0.50    Types: Cigarettes   Smokeless tobacco: Never  Vaping Use   Vaping status: Never Used  Substance and Sexual Activity   Alcohol use: Not Currently   Drug use: Not Currently    Types: Marijuana   Sexual activity: Yes  Other Topics Concern   Not on file  Social History Narrative   Not on file   Social Drivers of Health   Financial Resource Strain: Not on file  Food Insecurity: No Food Insecurity (06/04/2023)   Hunger Vital Sign    Worried About Running Out of Food in the Last Year: Never  true    Ran Out of Food in the Last Year: Never true  Transportation Needs: No Transportation Needs (06/04/2023)   PRAPARE - Administrator, Civil Service (Medical): No    Lack of Transportation (Non-Medical): No  Physical Activity: Not on file  Stress: Not on file  Social Connections: Not on file  Intimate Partner Violence: Not At Risk (06/04/2023)   Humiliation, Afraid, Rape, and Kick questionnaire    Fear of Current or Ex-Partner: No    Emotionally Abused: No    Physically Abused: No     Sexually Abused: No      FAMILY HISTORY:  History reviewed. No pertinent family history.   REVIEW OF SYSTEMS:  Constitutional: denies weight loss, fever, chills, or sweats  Eyes: denies any other vision changes, history of eye injury  ENT: denies sore throat, hearing problems  Respiratory: denies shortness of breath, wheezing  Cardiovascular: denies chest pain, palpitations  Gastrointestinal: Positive nausea and vomiting Genitourinary: denies burning with urination or urinary frequency Musculoskeletal: denies any other joint pains or cramps  Skin: denies any other rashes or skin discolorations  Neurological: denies any other headache, dizziness, weakness  Psychiatric: denies any other depression, anxiety   All other review of systems were negative   VITAL SIGNS:  Temp:  [97.8 F (36.6 C)-99.7 F (37.6 C)] 99.7 F (37.6 C) (02/23 2326) Pulse Rate:  [96-108] 108 (02/23 2326) Resp:  [13-19] 18 (02/23 2326) BP: (106-127)/(74-92) 107/74 (02/23 2326) SpO2:  [96 %-100 %] 99 % (02/23 2326)     Height: 6' (182.9 cm) Weight: 70.3 kg BMI (Calculated): 21.02   INTAKE/OUTPUT:  This shift: No intake/output data recorded.  Last 2 shifts: @IOLAST2SHIFTS @   PHYSICAL EXAM:  Constitutional:  -- Normal body habitus  -- Awake, alert, and oriented x3  Eyes:  -- Pupils equally round and reactive to light  -- No scleral icterus  Ear, nose, and throat:  -- No jugular venous distension  Pulmonary:  -- No crackles  -- Equal breath sounds bilaterally -- Breathing non-labored at rest Cardiovascular:  -- S1, S2 present  -- No pericardial rubs Gastrointestinal:  -- Abdomen soft, nontender, non-distended, no guarding or rebound tenderness -- No abdominal masses appreciated, pulsatile or otherwise  Musculoskeletal and Integumentary:  -- Wounds: None appreciated -- Extremities: Bilateral swelling of the lower extremity more in the left Neurologic:  -- Motor function: intact and symmetric --  Sensation: intact and symmetric   Labs:     Latest Ref Rng & Units 06/07/2023    4:14 AM 06/06/2023   12:54 AM 06/05/2023    3:27 AM  CBC  WBC 4.0 - 10.5 K/uL 8.8  8.9  8.5   Hemoglobin 13.0 - 17.0 g/dL 16.1  09.6  04.5   Hematocrit 39.0 - 52.0 % 37.8  36.5  36.8   Platelets 150 - 400 K/uL 240  256  235       Latest Ref Rng & Units 06/07/2023    4:14 AM 06/04/2023   12:20 PM 09/18/2020    9:46 AM  CMP  Glucose 70 - 99 mg/dL 99  89  91   BUN 6 - 20 mg/dL 15  13  11    Creatinine 0.61 - 1.24 mg/dL 4.09  8.11  9.14   Sodium 135 - 145 mmol/L 137  135  136   Potassium 3.5 - 5.1 mmol/L 4.3  4.3  4.0   Chloride 98 - 111 mmol/L 100  98  100  CO2 22 - 32 mmol/L 27  25  26    Calcium 8.9 - 10.3 mg/dL 9.3  9.0  9.6   Total Protein 6.5 - 8.1 g/dL  6.9  7.1   Total Bilirubin 0.0 - 1.2 mg/dL  0.3  0.6   Alkaline Phos 38 - 126 U/L  104  66   AST 15 - 41 U/L  20  23   ALT 0 - 44 U/L  14  20     Imaging studies:  EXAM: CT CHEST, ABDOMEN, AND PELVIS WITH CONTRAST   TECHNIQUE: Multidetector CT imaging of the chest, abdomen and pelvis was performed following the standard protocol during bolus administration of intravenous contrast.   RADIATION DOSE REDUCTION: This exam was performed according to the departmental dose-optimization program which includes automated exposure control, adjustment of the mA and/or kV according to patient size and/or use of iterative reconstruction technique.   CONTRAST:  80mL OMNIPAQUE IOHEXOL 300 MG/ML  SOLN   COMPARISON:  None Available.   FINDINGS: CT CHEST FINDINGS   Cardiovascular: No significant vascular findings. Normal heart size. No pericardial effusion. No PE identified.   Mediastinum/Nodes: Thickening through the distal esophagus leading up to the GE junction over approximately 4 cm.   Lymph node adjacent to the distal esophagus measures 10 mm on image 45/2. Subcarinal lymph node is also suspicious measuring 12 mm.   Lungs/Pleura: No  suspicious pulmonary nodules. Normal pleural. Airways normal.   Musculoskeletal: No aggressive osseous lesion.   CT ABDOMEN AND PELVIS FINDINGS   Hepatobiliary: Multiple round hypoenhancing lesions within LEFT RIGHT hepatic lobe. Conglomerate cluster of lesions in the posterior RIGHT hepatic lobe measures 6.75.6 cm on image 58. A discrete lesion in the central LEFT hepatic lobe measures 17 mm on 55/2. There approximately 20 lesions in liver. Gallbladder normal.   Pancreas: Pancreas is normal. No ductal dilatation. No pancreatic inflammation.   Spleen: Normal spleen   Adrenals/urinary tract: Adrenal glands and kidneys are normal. The ureters and bladder normal.   Stomach/Bowel: Stomach, small bowel, appendix, and cecum are normal. The colon and rectosigmoid colon are normal.   Vascular/Lymphatic: Abdominal aorta is normal caliber.   Small nodule in the gastrohepatic ligament and celiac nodal stations are suspicious given the hepatic metastasis.   Reproductive: Prostate unremarkable   Other: No peritoneal metastasis   Musculoskeletal: No aggressive osseous lesion. 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   IMPRESSION: CHEST:   1. 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 2. Suspicious lymph nodes adjacent to the distal esophagus and in the subcarinal nodal station. 3. No pulmonary metastasis.   PELVIS:   1. Multiple hypoenhancing lesions in the liver consistent with hepatic metastasis. 2. Probable metastatic adenopathy in the gastrohepatic ligament and celiac nodal stations. 3. No other evidence of metastatic disease in the abdomen pelvis.     Electronically Signed   By: Genevive Bi M.D.   On: 06/06/2023 17:31  Assessment/Plan:  43 y.o. male with stage IV esophageal cancer partially obstructing, complicated by pertinent comorbidities including DVT of the left lower extremity.  Patient with  partially obstructing esophageal cancer of the distal esophagus.  Patient was evaluated by medical oncology and they recommended to proceed with palliative treatment.  Patient will need Chemo-Port for chemotherapy and gastrostomy tube for nutrition.  Patient not able to tolerate solid food.  Without nutrition he will not be able to tolerate chemotherapy.  Patient was oriented about procedure.  Patient went about  the risk of pneumothorax, hemothorax, bleeding, infection, injury to adjacent organ in the abdomen, among others.  The patient reported he understood and agreed to proceed with insertion of Port-A-Cath and gastrostomy tube placement.  Patient high risk for bleeding due to anticoagulation.  Heparin was placed on hold.  Gae Gallop, MD

## 2023-06-07 NOTE — Progress Notes (Signed)
 Progress Note   Patient: Tyler Stanley ZOX:[id number] DOB: [birth year] DOA: 06/04/2023     1 DOS: the patient was seen and examined on 06/07/2023   Brief hospital course: SAMEL BRUNA is a 43 y.o. male with medical history significant of ADHD, scented with worsening of left calf pain. Also endorses  frequent episodes of nauseous throwing up especially after meal, feels food stuck in the middle of the chest, followed by throwing up of undigested food, and he frequently vomited clear secretions. Lost significant amount of weight.  DVT study positive for acute DVT in left calf to mid femoral vein.  Patient is started on IV heparin drip, no vascular surgery recommended.  GI evaluation called, EGD is highly concerning for malignant esophageal stricture at the GE junction and distal esophagus. Pending path report. Oncology evaluation called.  CT chest/ abdomen/ pelvis showed thickening of the distal esophagus consistent with primary esophageal adenocarcinoma. Suspicious lymph nodes adjacent to the distal esophagus and in the subcarinal nodal station. Multiple hypoenhancing lesions in the liver consistent with hepatic metastasis. Probable metastatic adenopathy in the gastrohepatic ligament and celiac nodal stations.  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 Pending pathology. Causing intractable nausea and vomiting. He is tolerating minimal oral fluids. Seen by Oncology team - advised PEG tube, port placement. Radiation therapy for palliative intent. Palliative care consult, chaplain services ordered. Nutrition consult for PEG feeds.  Acute nonprovoked left lower extremity DVT Vascular surgery reviewed the case and recommend no embolectomy.  Transition to Eliquis tomorrow after PEG tube placement and able to tolerate feeds.   Tobacco abuse Marijuana use Urine drug screen pending. Advised  smoking cessation. Nicotine patch ordered.    Nursing supportive care. Fall, aspiration precautions. DVT prophylaxis   Code Status: Full Code  Subjective: Patient is seen and examined today morning. He discussed with oncologist. Patient no distress. Has no complaints. Knows about PEG/ PORT placement today. Mother at bedside weeping.   Physical Exam: Vitals:   06/07/23 1445 06/07/23 1500 06/07/23 1515 06/07/23 1530  BP: 120/79 101/64 113/86 127/89  Pulse: 96 95 93 92  Resp: 19 16 19 18   Temp:      TempSrc:      SpO2: 100% 98% 98% 100%  Weight:      Height:        General - Young  Caucasian thin built male, no acute distress. HEENT - PERRLA, EOMI, atraumatic head, non tender sinuses. Lung - Clear, no rales, rhonchi, wheezes. Heart - S1, S2 heard, no murmurs, rubs, no pedal edema. Abdomen - Soft, non tender, bowel sounds good Neuro - Alert, awake and oriented x 3, non focal exam. Skin - Warm and dry. Left calf swelling improved.  Data Reviewed:      Latest Ref Rng & Units 06/07/2023    4:14 AM 06/06/2023   12:54 AM 06/05/2023    3:27 AM  CBC  WBC 4.0 - 10.5 K/uL 8.8  8.9  8.5   Hemoglobin 13.0 - 17.0 g/dL 81.1  91.4  78.2   Hematocrit 39.0 - 52.0 % 37.8  36.5  36.8   Platelets 150 - 400 K/uL 240  256  235       Latest Ref Rng & Units 06/07/2023    4:14 AM 06/04/2023   12:20 PM 09/18/2020    9:46 AM  BMP  Glucose 70 - 99 mg/dL 99  89  91   BUN 6 -  20 mg/dL 15  13  11    Creatinine 0.61 - 1.24 mg/dL 1.30  8.65  7.84   Sodium 135 - 145 mmol/L 137  135  136   Potassium 3.5 - 5.1 mmol/L 4.3  4.3  4.0   Chloride 98 - 111 mmol/L 100  98  100   CO2 22 - 32 mmol/L 27  25  26    Calcium 8.9 - 10.3 mg/dL 9.3  9.0  9.6    DG C-Arm 1-60 Min-No Report Result Date: 06/07/2023 Fluoroscopy was utilized by the requesting physician.  No radiographic interpretation.   CT CHEST ABDOMEN PELVIS W CONTRAST Result Date: 06/06/2023 CLINICAL DATA:  Deep venous thrombosis. Concern for  metastatic disease. 43 year old male. Esophageal mass on endoscopy. * Tracking Code: BO * EXAM: CT CHEST, ABDOMEN, AND PELVIS WITH CONTRAST TECHNIQUE: Multidetector CT imaging of the chest, abdomen and pelvis was performed following the standard protocol during bolus administration of intravenous contrast. RADIATION DOSE REDUCTION: This exam was performed according to the departmental dose-optimization program which includes automated exposure control, adjustment of the mA and/or kV according to patient size and/or use of iterative reconstruction technique. CONTRAST:  80mL OMNIPAQUE IOHEXOL 300 MG/ML  SOLN COMPARISON:  None Available. FINDINGS: CT CHEST FINDINGS Cardiovascular: No significant vascular findings. Normal heart size. No pericardial effusion. No PE identified. Mediastinum/Nodes: Thickening through the distal esophagus leading up to the GE junction over approximately 4 cm. Lymph node adjacent to the distal esophagus measures 10 mm on image 45/2. Subcarinal lymph node is also suspicious measuring 12 mm. Lungs/Pleura: No suspicious pulmonary nodules. Normal pleural. Airways normal. Musculoskeletal: No aggressive osseous lesion. CT ABDOMEN AND PELVIS FINDINGS Hepatobiliary: Multiple round hypoenhancing lesions within LEFT RIGHT hepatic lobe. Conglomerate cluster of lesions in the posterior RIGHT hepatic lobe measures 6.75.6 cm on image 58. A discrete lesion in the central LEFT hepatic lobe measures 17 mm on 55/2. There approximately 20 lesions in liver. Gallbladder normal. Pancreas: Pancreas is normal. No ductal dilatation. No pancreatic inflammation. Spleen: Normal spleen Adrenals/urinary tract: Adrenal glands and kidneys are normal. The ureters and bladder normal. Stomach/Bowel: Stomach, small bowel, appendix, and cecum are normal. The colon and rectosigmoid colon are normal. Vascular/Lymphatic: Abdominal aorta is normal caliber. Small nodule in the gastrohepatic ligament and celiac nodal stations are  suspicious given the hepatic metastasis. Reproductive: Prostate unremarkable Other: No peritoneal metastasis Musculoskeletal: No aggressive osseous lesion. 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 IMPRESSION: CHEST: 1. 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 2. Suspicious lymph nodes adjacent to the distal esophagus and in the subcarinal nodal station. 3. No pulmonary metastasis. PELVIS: 1. Multiple hypoenhancing lesions in the liver consistent with hepatic metastasis. 2. Probable metastatic adenopathy in the gastrohepatic ligament and celiac nodal stations. 3. No other evidence of metastatic disease in the abdomen pelvis. Electronically Signed   By: Genevive Bi M.D.   On: 06/06/2023 17:31    Family Communication: Discussed with patient and mother at bedside, they understand and agree. All questions answereed.  Disposition: Status is: Observation The patient will require care spanning > 2 midnights and should be moved to inpatient because: new metastatic GE junction mass, unable to tolerate diet.  Planned Discharge Destination: Home     Time spent: 41 minutes  Author: Marcelino Duster, MD 06/07/2023 4:03 PM Secure chat 7am to 7pm For on call review [email or web address].

## 2023-06-07 NOTE — Transfer of Care (Signed)
 Immediate Anesthesia Transfer of Care Note  Patient: Tyler Stanley  Procedure(s) Performed: INSERTION PORT-A-CATH (Right) INSERTION OF GASTROSTOMY TUBE  Patient Location: PACU  Anesthesia Type:General  Level of Consciousness: drowsy  Airway & Oxygen Therapy: Patient Spontanous Breathing and Patient connected to face mask  Post-op Assessment: Report given to RN and Post -op Vital signs reviewed and stable  Post vital signs: Reviewed and stable  Last Vitals:  Vitals Value Taken Time  BP 124/80 06/07/23 1430  Temp 36.8 C 06/07/23 1429  Pulse 100 06/07/23 1432  Resp 17 06/07/23 1432  SpO2 100 % 06/07/23 1432  Vitals shown include unfiled device data.  Last Pain:  Vitals:   06/07/23 1150  TempSrc: Temporal  PainSc: 5       Patients Stated Pain Goal: 0 (06/07/23 1150)  Complications: No notable events documented.

## 2023-06-07 NOTE — Plan of Care (Signed)
 Consult noted for GOC. Patient currently off unit in OR for port and PEG placement. No family present in room. Will reattempt tomorrow.

## 2023-06-07 NOTE — Consult Note (Signed)
 PHARMACY - ANTICOAGULATION CONSULT NOTE  Pharmacy Consult for Heparin Indication: DVT  No Known Allergies  Patient Measurements: Height: 6' (182.9 cm) Weight: 70.3 kg (155 lb) IBW/kg (Calculated) : 77.6 Heparin Dosing Weight: 70.3 kg  Vital Signs: Temp: 97.9 F (36.6 C) (02/24 0849) Temp Source: Oral (02/23 2326) BP: 108/88 (02/24 0849) Pulse Rate: 98 (02/24 0849)  Labs: Recent Labs    06/04/23 1220 06/04/23 1917 06/05/23 0327 06/05/23 1000 06/06/23 0054 06/06/23 1730 06/07/23 0108 06/07/23 0414 06/07/23 0753  HGB 13.2  --  12.7*  --  12.5*  --   --  12.6*  --   HCT 39.7  --  36.8*  --  36.5*  --   --  37.8*  --   PLT 241  --  235  --  256  --   --  240  --   APTT 34  --   --   --   --   --   --   --   --   LABPROT 14.8  --   --   --   --   --   --   --   --   INR 1.1  --   --   --   --   --   --   --   --   HEPARINUNFRC  --    < > 0.31   < > 0.25* 0.19* 0.36  --  0.18*  CREATININE 0.77  --   --   --   --   --   --  0.90  --    < > = values in this interval not displayed.    Estimated Creatinine Clearance: 105.2 mL/min (by C-G formula based on SCr of 0.9 mg/dL).   Medical History: Past Medical History:  Diagnosis Date   ADHD     Pertinent Medications:  No history of chronic anticoagulant use PTA.  Assessment: 43 y.o. male with no significant past medical history presents emergency department complaining of left calf pain for 4 to 5 days.  Patient states there has been some swelling, no history of blood clots, feels better when it is elevated.  No known injury. Vascular to see patient. Pharmacy has been consulted to initiate and monitor continuous heparin infusion.  Date/Time HL Rate  Comment 2/21 1917 <0.1 1200 units/hr Subtherapeutic 2/22 0327 0.31 1400 un/hr Therapeutic x 1 2/22 1000 0.16 1400 units/hr  Subtherapeutic 2/22 1654 0.24 1600 units/hr Subtherapeutic 2/23 0054 0.25 1700 units/hr Subtherapeutic 2/23 1730 0.19 1850  units/hr Subtherapeutic 2/24 0108 0.36 2000 un/hr Therapeutic x 1 2/24 0735  heparin drip stopped- see MAR note, for surgery/placement of - PEGtube and PortAcath for chemo   Goal of Therapy:  Heparin level 0.3-0.7 units/ml Monitor platelets by anticoagulation protocol: Yes   Plan:  Heparin on hold.  F/u restart after procedure CBC daily while on heparin.   Bari Mantis PharmD Clinical Pharmacist 06/07/2023

## 2023-06-07 NOTE — Consult Note (Signed)
 PHARMACY - ANTICOAGULATION CONSULT NOTE  Pharmacy Consult for Heparin Indication: DVT  No Known Allergies  Patient Measurements: Height: 6' (182.9 cm) Weight: 70.3 kg (155 lb) IBW/kg (Calculated) : 77.6 Heparin Dosing Weight: 70.3 kg  Vital Signs: Temp: 99.7 F (37.6 C) (02/23 2326) Temp Source: Oral (02/23 2326) BP: 107/74 (02/23 2326) Pulse Rate: 108 (02/23 2326)  Labs: Recent Labs    06/04/23 1220 06/04/23 1917 06/05/23 0327 06/05/23 1000 06/06/23 0054 06/06/23 1730 06/07/23 0108  HGB 13.2  --  12.7*  --  12.5*  --   --   HCT 39.7  --  36.8*  --  36.5*  --   --   PLT 241  --  235  --  256  --   --   APTT 34  --   --   --   --   --   --   LABPROT 14.8  --   --   --   --   --   --   INR 1.1  --   --   --   --   --   --   HEPARINUNFRC  --    < > 0.31   < > 0.25* 0.19* 0.36  CREATININE 0.77  --   --   --   --   --   --    < > = values in this interval not displayed.    Estimated Creatinine Clearance: 118.4 mL/min (by C-G formula based on SCr of 0.77 mg/dL).   Medical History: Past Medical History:  Diagnosis Date   ADHD     Pertinent Medications:  No history of chronic anticoagulant use PTA.  Assessment: 43 y.o. male with no significant past medical history presents emergency department complaining of left calf pain for 4 to 5 days.  Patient states there has been some swelling, no history of blood clots, feels better when it is elevated.  No known injury. Vascular to see patient. Pharmacy has been consulted to initiate and monitor continuous heparin infusion.  Date/Time HL Rate  Comment 2/21 1917 <0.1 1200 units/hr Subtherapeutic 2/22 0327 0.31 1400 un/hr Therapeutic x 1 2/22 1000 0.16 1400 units/hr  Subtherapeutic 2/22 1654 0.24 1600 units/hr Subtherapeutic 2/23 0054 0.25 1700 units/hr Subtherapeutic 2/23 1730 0.19 1850 units/hr Subtherapeutic 2/24 0108 0.36 2000 un/hr Therapeutic x 1  Goal of Therapy:  Heparin level 0.3-0.7 units/ml Monitor  platelets by anticoagulation protocol: Yes   Plan:  Continue heparin infusion at 2000 units/hr.  Recheck HL at 0800 to confirm CBC daily while on heparin.   Otelia Sergeant, PharmD, [facility] 06/07/2023 2:31 AM

## 2023-06-07 NOTE — Anesthesia Procedure Notes (Signed)
 Procedure Name: Intubation Date/Time: 06/07/2023 12:42 PM  Performed by: Malva Cogan, CRNAPre-anesthesia Checklist: Patient identified, Patient being monitored, Timeout performed, Emergency Drugs available and Suction available Patient Re-evaluated:Patient Re-evaluated prior to induction Oxygen Delivery Method: Circle system utilized Preoxygenation: Pre-oxygenation with 100% oxygen Induction Type: IV induction Ventilation: Mask ventilation without difficulty Laryngoscope Size: McGrath and 4 Grade View: Grade I Tube type: Oral Tube size: 7.5 mm Number of attempts: 1 Airway Equipment and Method: Stylet Placement Confirmation: ETT inserted through vocal cords under direct vision, positive ETCO2 and breath sounds checked- equal and bilateral Secured at: 22 cm Tube secured with: Tape Dental Injury: Teeth and Oropharynx as per pre-operative assessment

## 2023-06-07 NOTE — Op Note (Signed)
 SURGICAL PROCEDURE REPORT  DATE OF PROCEDURE: 06/07/2023   SURGEON: Dr. Hazle Quant   ANESTHESIA: Local with light IV sedation   PRE-OPERATIVE DIAGNOSIS: Metastatic esophageal cancer requiring durable central venous access for chemotherapy   POST-OPERATIVE DIAGNOSIS: Same  PROCEDURE(S): (cpt: 36561) 1.) Percutaneous access of Right internal jugular vein under ultrasound guidance 2.) Insertion of tunneled Right internal jugular central venous catheter with subcutaneous port  INTRAOPERATIVE FINDINGS: Patent easily compressible Right internal jugular vein with appropriate respiratory variations and well-secured tunneled central venous catheter with subcutaneous port at completion of the procedure  ESTIMATED BLOOD LOSS: Minimal (<20 mL)   SPECIMENS: None   IMPLANTS: 52F tunneled Bard PowerPort central venous catheter with subcutaneous port  DRAINS: None   COMPLICATIONS: None apparent   CONDITION AT COMPLETION: Hemodynamically stable, awake   DISPOSITION: PACU   INDICATION(S) FOR PROCEDURE:  Patient is a 43 y.o. male who presented with metastatic esophageal cancer requiring durable central venous access for chemotherapy. All risks, benefits, and alternatives to above elective procedures were discussed with the patient, who elected to proceed, and informed consent was accordingly obtained at that time.  DETAILS OF PROCEDURE:  Patient was brought to the operative suite and appropriately identified. In Trendelenburg position, Right IJ venous access site was prepped and draped in the usual sterile fashion, and following a brief timeout, percutaneous Right IJ venous access was obtained under ultrasound guidance using Seldinger technique, by which local anesthetic was injected over the Right IJ vein, and access needle was inserted under direct ultrasound visualization into the Right IJ vein, through which soft guidewire was advanced, over which access needle was withdrawn. Guidewire was  secured, attention was directed to injection of local anesthetic along the planned tunnel site, 2-3 cm transverse Right chest incision was made and confirmed to accommodate the subcutaneous port, and flushed catheter was tunneled retrograde from the port site over the Right chest to the Right IJ access site with the attached port well-secured to the catheter and within the subcutaneous pocket. Insertion sheath was advanced over the guidewire, which was withdrawn along with the insertion sheath dilator. The catheter was introduced through the sheath and left on the Atrio [place] junction under fluoro guidance and catheter cut to desire lenght. Catheter connected to port and fixed to the pocket on two side to avoid twisting. Port was confirmed to withdraw blood and flush easily, after which concentrated heparin was instilled into the port and catheter. Dermis at the subcutaneous pocket was re-approximated using buried interrupted 3-0 Vicryl suture, and 4-0 Monocryl suture was used to re-approximate skin at the insertion and subcutaneous port sites in running subcuticular fashion for the subcutaneous port and buried interrupted fashion for the insertion site. Skin was cleaned, dried, and sterile skin glue was applied. Patient was then safely transferred to PACU for a chest x-ray. Ultrasound images are available on paper chart and Fluoroscopy guidance images are available in Epic.

## 2023-06-08 ENCOUNTER — Telehealth: Payer: Self-pay

## 2023-06-08 ENCOUNTER — Encounter: Payer: Self-pay | Admitting: General Surgery

## 2023-06-08 ENCOUNTER — Ambulatory Visit: Payer: 59 | Attending: Radiation Oncology | Admitting: Radiation Oncology

## 2023-06-08 DIAGNOSIS — Z7189 Other specified counseling: Secondary | ICD-10-CM | POA: Diagnosis not present

## 2023-06-08 DIAGNOSIS — I824Z2 Acute embolism and thrombosis of unspecified deep veins of left distal lower extremity: Secondary | ICD-10-CM | POA: Diagnosis not present

## 2023-06-08 DIAGNOSIS — E43 Unspecified severe protein-calorie malnutrition: Secondary | ICD-10-CM | POA: Diagnosis present

## 2023-06-08 DIAGNOSIS — F17200 Nicotine dependence, unspecified, uncomplicated: Secondary | ICD-10-CM | POA: Diagnosis not present

## 2023-06-08 DIAGNOSIS — R1312 Dysphagia, oropharyngeal phase: Secondary | ICD-10-CM | POA: Diagnosis not present

## 2023-06-08 DIAGNOSIS — F1290 Cannabis use, unspecified, uncomplicated: Secondary | ICD-10-CM | POA: Diagnosis not present

## 2023-06-08 LAB — SURGICAL PATHOLOGY

## 2023-06-08 LAB — BASIC METABOLIC PANEL
Anion gap: 9 (ref 5–15)
BUN: 15 mg/dL (ref 6–20)
CO2: 28 mmol/L (ref 22–32)
Calcium: 9.5 mg/dL (ref 8.9–10.3)
Chloride: 99 mmol/L (ref 98–111)
Creatinine, Ser: 0.71 mg/dL (ref 0.61–1.24)
GFR, Estimated: >60 mL/min (ref >60)
Glucose, Bld: 127 mg/dL — ABNORMAL HIGH (ref 70–99)
Potassium: 5 mmol/L (ref 3.5–5.1)
Sodium: 136 mmol/L (ref 135–145)

## 2023-06-08 LAB — HEPARIN LEVEL (UNFRACTIONATED)
Heparin Unfractionated: 0.21 [IU]/mL — ABNORMAL LOW (ref 0.30–0.70)
Heparin Unfractionated: 0.4 [IU]/mL (ref 0.30–0.70)

## 2023-06-08 LAB — CBC
HCT: 38.7 % — ABNORMAL LOW (ref 39.0–52.0)
Hemoglobin: 13.1 g/dL (ref 13.0–17.0)
MCH: 28.8 pg (ref 26.0–34.0)
MCHC: 33.9 g/dL (ref 30.0–36.0)
MCV: 85.1 fL (ref 80.0–100.0)
Platelets: 329 10*3/uL (ref 150–400)
RBC: 4.55 MIL/uL (ref 4.22–5.81)
RDW: 11.7 % (ref 11.5–15.5)
WBC: 9.4 10*3/uL (ref 4.0–10.5)
nRBC: 0 % (ref 0.0–0.2)

## 2023-06-08 LAB — PHOSPHORUS: Phosphorus: 4 mg/dL (ref 2.5–4.6)

## 2023-06-08 LAB — MAGNESIUM: Magnesium: 2.2 mg/dL (ref 1.7–2.4)

## 2023-06-08 MED ORDER — METHOCARBAMOL 500 MG PO TABS
500.0000 mg | ORAL_TABLET | Freq: Three times a day (TID) | ORAL | Status: DC
  Administered 2023-06-08: 500 mg via ORAL
  Filled 2023-06-08: qty 1

## 2023-06-08 MED ORDER — KETOROLAC TROMETHAMINE 30 MG/ML IJ SOLN
30.0000 mg | Freq: Four times a day (QID) | INTRAMUSCULAR | Status: AC
  Administered 2023-06-08 (×3): 30 mg via INTRAVENOUS
  Filled 2023-06-08 (×3): qty 1

## 2023-06-08 MED ORDER — APIXABAN 5 MG PO TABS: 5.0000 mg | ORAL_TABLET | Freq: Two times a day (BID) | ORAL | Status: DC

## 2023-06-08 MED ORDER — ACETAMINOPHEN 325 MG PO TABS: 650.0000 mg | ORAL_TABLET | Freq: Four times a day (QID) | ORAL | Status: DC | PRN

## 2023-06-08 MED ORDER — DOCUSATE SODIUM 50 MG/5ML PO LIQD: 100.0000 mg | Freq: Two times a day (BID) | ORAL | Status: DC

## 2023-06-08 MED ORDER — PREGABALIN 50 MG PO CAPS
100.0000 mg | ORAL_CAPSULE | Freq: Three times a day (TID) | ORAL | Status: DC
  Administered 2023-06-08: 100 mg via ORAL
  Filled 2023-06-08: qty 2

## 2023-06-08 MED ORDER — HEPARIN BOLUS VIA INFUSION
1050.0000 [IU] | Freq: Once | INTRAVENOUS | Status: AC
  Administered 2023-06-08: 1050 [IU] via INTRAVENOUS
  Filled 2023-06-08: qty 1050

## 2023-06-08 MED ORDER — SENNOSIDES 8.8 MG/5ML PO SYRP
5.0000 mL | ORAL_SOLUTION | Freq: Every day | ORAL | Status: DC
  Administered 2023-06-08 – 2023-06-09 (×2): 5 mL via ORAL
  Filled 2023-06-08 (×2): qty 5

## 2023-06-08 MED ORDER — POLYETHYLENE GLYCOL 3350 17 G PO PACK
17.0000 g | PACK | Freq: Every day | ORAL | Status: DC
  Filled 2023-06-08: qty 1

## 2023-06-08 MED ORDER — SENNOSIDES 8.8 MG/5ML PO SYRP: 5.0000 mL | ORAL_SOLUTION | Freq: Every day | ORAL | Status: DC

## 2023-06-08 MED ORDER — ACETAMINOPHEN 650 MG RE SUPP
650.0000 mg | Freq: Four times a day (QID) | RECTAL | Status: DC | PRN
Start: 2023-06-08 — End: 2023-06-08

## 2023-06-08 MED ORDER — OSMOLITE 1.5 CAL PO LIQD
80.0000 mL | Freq: Every day | ORAL | Status: AC
  Administered 2023-06-08 (×4): 80 mL

## 2023-06-08 MED ORDER — METHOCARBAMOL 500 MG PO TABS
500.0000 mg | ORAL_TABLET | Freq: Three times a day (TID) | ORAL | Status: DC
  Administered 2023-06-08 – 2023-06-09 (×3): 500 mg via ORAL
  Filled 2023-06-08 (×3): qty 1

## 2023-06-08 MED ORDER — OXYCODONE HCL 5 MG PO TABS
5.0000 mg | ORAL_TABLET | ORAL | Status: DC | PRN
  Administered 2023-06-08 – 2023-06-10 (×5): 5 mg via ORAL
  Filled 2023-06-08 (×5): qty 1

## 2023-06-08 MED ORDER — APIXABAN 5 MG PO TABS
5.0000 mg | ORAL_TABLET | Freq: Two times a day (BID) | ORAL | Status: DC
  Administered 2023-06-08 – 2023-06-10 (×5): 5 mg via ORAL
  Filled 2023-06-08 (×5): qty 1

## 2023-06-08 MED ORDER — OXYCODONE HCL 5 MG PO TABS: 5.0000 mg | ORAL_TABLET | ORAL | Status: DC | PRN

## 2023-06-08 MED ORDER — FREE WATER
100.0000 mL | Freq: Every day | Status: DC
  Administered 2023-06-08 – 2023-06-10 (×12): 100 mL

## 2023-06-08 MED ORDER — ACETAMINOPHEN 650 MG RE SUPP: 650.0000 mg | Freq: Four times a day (QID) | RECTAL | Status: DC | PRN

## 2023-06-08 MED ORDER — THIAMINE HCL 100 MG PO TABS
100.0000 mg | ORAL_TABLET | Freq: Every day | ORAL | Status: DC
  Administered 2023-06-09 – 2023-06-10 (×2): 100 mg via ORAL
  Filled 2023-06-08 (×3): qty 1

## 2023-06-08 MED ORDER — DOCUSATE SODIUM 50 MG/5ML PO LIQD
100.0000 mg | Freq: Two times a day (BID) | ORAL | Status: DC
  Administered 2023-06-08 – 2023-06-10 (×4): 100 mg via ORAL
  Filled 2023-06-08 (×4): qty 10

## 2023-06-08 MED ORDER — THIAMINE HCL 100 MG PO TABS: 100.0000 mg | ORAL_TABLET | Freq: Every day | ORAL | Status: DC

## 2023-06-08 MED ORDER — PREGABALIN 50 MG PO CAPS: 100.0000 mg | ORAL_CAPSULE | Freq: Three times a day (TID) | ORAL | Status: DC

## 2023-06-08 MED ORDER — OSMOLITE 1.5 CAL PO LIQD
120.0000 mL | Freq: Every day | ORAL | Status: AC
  Administered 2023-06-09 (×5): 120 mL

## 2023-06-08 MED ORDER — OSMOLITE 1.5 CAL PO LIQD
237.0000 mL | Freq: Every day | ORAL | Status: DC
  Administered 2023-06-10 (×3): 237 mL

## 2023-06-08 MED ORDER — POLYETHYLENE GLYCOL 3350 17 G PO PACK
17.0000 g | PACK | Freq: Every day | ORAL | Status: DC
  Administered 2023-06-08 – 2023-06-10 (×3): 17 g via ORAL
  Filled 2023-06-08 (×2): qty 1

## 2023-06-08 MED ORDER — METHOCARBAMOL 500 MG PO TABS: 500.0000 mg | ORAL_TABLET | Freq: Three times a day (TID) | ORAL | Status: DC

## 2023-06-08 MED ORDER — PREGABALIN 50 MG PO CAPS
100.0000 mg | ORAL_CAPSULE | Freq: Three times a day (TID) | ORAL | Status: DC
  Administered 2023-06-08 – 2023-06-10 (×6): 100 mg via ORAL
  Filled 2023-06-08 (×6): qty 2

## 2023-06-08 NOTE — Consult Note (Addendum)
 Consultation Note Date: 06/08/2023   Patient Name: Tyler Stanley  DOB: [birth year]  MRN: [id number]  Age / Sex: 43 y.o., male  PCP: [facility] Referring Physician: Marcelino Duster, MD  Reason for Consultation: Establishing goals of care  HPI/Patient Profile:  PER H&P: "Tyler Stanley is a 43 y.o. male with medical history significant of ADHD, scented with worsening of left calf pain.   Symptoms started 5 days ago, when patient had " twisting a muscle" during his work.  Gradually getting worse, has been taking OTC pain medications with little relief.  Denies any chest pain shortness of breath.  Patient further reported that for the last 5 to 6 weeks has been having frequent episodes of nauseous throwing up especially after eat or drink, and occasionally he also felt food stuck in the middle of the chest then followed by throwing up of undigested food, and he frequently vomited clear secretions.  Denied any sore throat no cough.  He was seen by PCP and started on PPI, and referred by PCP to see GI for outpatient EGD.  He estimated lost about 15 pounds in the last few months."  Clinical Assessment and Goals of Care: Notes, labs, diagnostics reviewed in detail.  Into see patient.  He is currently resting in bed sleeping, but awakens to my entry.  He discusses that he is just waking up from what feels to be a spiritual dream he just had.  He immediately begins discussing the dream and the fact that he had been far from God for a while, but over the past 72 hours has made his peace with God.  He discusses being 43 years old, coming in for leg pain, and finding out that he has a DVT in addition to stage IV metastatic cancer.  He states he understands that oncologic treatment both medical and radiation is palliative and not curative, and states he has thought a lot about end-of-life, death, and what is  after.  He states he understands his time is limited and has been thinking about how he wants to spend his time.  He discusses changes he would like to his life including stopping smoking and recreational drug use. He discusses potentially new relationships in his life that he would welcome, including a potential relationship with a boy who may possibly be his son.  He states he is not married.  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   Currently patient has a port and PEG which were placed yesterday.  Primary team initiating tube feeds. With discussion regarding goals of care and his thoughts and wishes, he would like to pursue any and all care available at this time to prolong his life.  He is hopeful to be able to go home tomorrow.    Patient's mother shares that she would like to provide any support that he may need.  She states she plans to move him to a trailer on her property, so he will be close by but also have his privacy.  She states she is aware of his limited prognosis, and is hopeful for the best use of what time he has left.  She discusses her feelings, and God's sovereignty all things.    SUMMARY OF RECOMMENDATIONS   Full code/full scope at this time.         Primary Diagnoses: Present on Admission:  Smoking  DVT (deep vein thrombosis) in pregnancy  Acute DVT (deep venous thrombosis) (HCC)   I have reviewed the medical record, interviewed the patient and family, and examined the patient. The following aspects are pertinent.  Past Medical History:  Diagnosis Date   ADHD    Social History   Socioeconomic History   Marital status: Single    Spouse name: Not on file   Number of children: Not on file   Years of education: Not on file   Highest education level: Not on file  Occupational History   Not on file  Tobacco Use   Smoking status: Every Day    Current packs/day: 0.50    Types: Cigarettes   Smokeless tobacco: Never  Vaping Use   Vaping status: Never Used  Substance  and Sexual Activity   Alcohol use: Not Currently   Drug use: Not Currently    Types: Marijuana   Sexual activity: Yes  Other Topics Concern   Not on file  Social History Narrative   Not on file   Social Drivers of Health   Financial Resource Strain: Not on file  Food Insecurity: No Food Insecurity (06/04/2023)   Hunger Vital Sign    Worried About Running Out of Food in the Last Year: Never true    Ran Out of Food in the Last Year: Never true  Transportation Needs: No Transportation Needs (06/04/2023)   PRAPARE - Administrator, Civil Service (Medical): No    Lack of Transportation (Non-Medical): No  Physical Activity: Not on file  Stress: Not on file  Social Connections: Not on file   History reviewed. No pertinent family history. Scheduled Meds:  docusate  100 mg Oral BID   feeding supplement  237 mL Oral BID BM   [START ON 06/09/2023] feeding supplement (OSMOLITE 1.5 CAL)  120 mL Per Tube 6 X Daily   [START ON 06/10/2023] feeding supplement (OSMOLITE 1.5 CAL)  237 mL Per Tube 6 X Daily   feeding supplement (OSMOLITE 1.5 CAL)  80 mL Per Tube 6 X Daily   free water  100 mL Per Tube 6 X Daily   ketorolac  30 mg Intravenous Q6H   methocarbamol  500 mg Oral TID   nicotine  14 mg Transdermal Daily   pantoprazole (PROTONIX) IV  40 mg Intravenous Q12H   pregabalin  100 mg Oral TID   sennosides  5 mL Oral QHS   sucralfate  1 g Oral TID WC & HS   [START ON 06/09/2023] thiamine  100 mg Per Tube Daily   Continuous Infusions:  heparin 2,150 Units/hr (06/08/23 1200)   PRN Meds:.acetaminophen **OR** acetaminophen, morphine injection, nicotine polacrilex, ondansetron (ZOFRAN) IV, oxyCODONE Medications Prior to Admission:  Prior to Admission medications   Medication Sig Start Date End Date Taking? Authorizing Provider  omeprazole (PRILOSEC) 20 MG capsule Take 20 mg by mouth daily. 06/02/23  Yes [provider]  ibuprofen (ADVIL) 800 MG tablet Take 1 tablet (800 mg  total) by mouth every 8 (eight) hours as needed for moderate pain. Patient not taking: Reported on 06/04/2023 09/18/20   Shaune Pollack,  MD   No Known Allergies Review of Systems  All other systems reviewed and are negative.   Physical Exam Pulmonary:     Effort: Pulmonary effort is normal.  Neurological:     Mental Status: He is alert.     Vital Signs: BP 130/86 (BP Location: Left Arm)   Pulse 96   Temp 98.2 F (36.8 C) (Oral)   Resp 16   Ht 6' (1.829 m)   Wt 72.5 kg   SpO2 97%   BMI 21.68 kg/m  Pain Scale: 0-10 POSS *See Group Information*: S-Acceptable,Sleep, easy to arouse Pain Score: Asleep   SpO2: SpO2: 97 % O2 Device:SpO2: 97 % O2 Flow Rate: .O2 Flow Rate (L/min): 2 L/min  IO: Intake/output summary:  Intake/Output Summary (Last 24 hours) at 06/08/2023 1225 Last data filed at 06/07/2023 2023 Gross per 24 hour  Intake 1220 ml  Output 355 ml  Net 865 ml    LBM: Last BM Date : 05/30/23 (MD aware) Baseline Weight: Weight: 70.3 kg Most recent weight: Weight: 72.5 kg       Signed by: Morton Stall, NP   Please contact Palliative Medicine Team phone at [phone] for questions and concerns.  For individual provider: See Loretha Stapler

## 2023-06-08 NOTE — Consult Note (Signed)
 PHARMACY - ANTICOAGULATION CONSULT NOTE  Pharmacy Consult for Heparin Indication: DVT  No Known Allergies  Patient Measurements: Height: 6' (182.9 cm) Weight: 70.3 kg (155 lb) IBW/kg (Calculated) : 77.6 Heparin Dosing Weight: 70.3 kg  Vital Signs: Temp: 97.5 F (36.4 C) (02/24 1650) Temp Source: Oral (02/24 1650) BP: 119/91 (02/24 1650) Pulse Rate: 89 (02/24 1650)  Labs: Recent Labs    06/06/23 0054 06/06/23 1730 06/07/23 0108 06/07/23 0414 06/07/23 0753 06/08/23 0308  HGB 12.5*  --   --  12.6*  --  13.1  HCT 36.5*  --   --  37.8*  --  38.7*  PLT 256  --   --  240  --  329  HEPARINUNFRC 0.25*   < > 0.36  --  0.18* 0.21*  CREATININE  --   --   --  0.90  --  0.71   < > = values in this interval not displayed.    Estimated Creatinine Clearance: 118.4 mL/min (by C-G formula based on SCr of 0.71 mg/dL).   Medical History: Past Medical History:  Diagnosis Date   ADHD     Pertinent Medications:  No history of chronic anticoagulant use PTA.  Assessment: 43 y.o. male with no significant past medical history presents emergency department complaining of left calf pain for 4 to 5 days.  Patient states there has been some swelling, no history of blood clots, feels better when it is elevated.  No known injury. Vascular to see patient. Pharmacy has been consulted to initiate and monitor continuous heparin infusion.  Date/Time HL Rate  Comment 2/21 1917 <0.1 1200 units/hr Subtherapeutic 2/22 0327 0.31 1400 un/hr Therapeutic x 1 2/22 1000 0.16 1400 units/hr  Subtherapeutic 2/22 1654 0.24 1600 units/hr Subtherapeutic 2/23 0054 0.25 1700 units/hr Subtherapeutic 2/23 1730 0.19 1850 units/hr Subtherapeutic 2/24 0108 0.36 2000 un/hr Therapeutic x 1 2/24 0735  heparin drip stopped- see MAR note, for surgery/placement of - PEGtube and PortAcath for chemo 2/25 0308       0.21     20000 un/hr     SUBtherapeutic   Goal of Therapy:  Heparin level 0.3-0.7 units/ml Monitor  platelets by anticoagulation protocol: Yes   Plan:  2/25:  HL @ 0308 = 0.21, SUBtherapeutic - Will order heparin 1050 units IV X 1 bolus and increase drip rate to 2150 units/hr - Will recheck HL 6 hrs after rate change  Desiderio Dolata D Clinical Pharmacist 06/08/2023

## 2023-06-08 NOTE — Telephone Encounter (Signed)
 Request sent to [facility] for NGS, [id number], PDL1 on specimen [id number].

## 2023-06-08 NOTE — Telephone Encounter (Signed)
 Request for Her-2 sent on specimen [id number].

## 2023-06-08 NOTE — Progress Notes (Signed)
 Patient ID: Tyler Stanley, male   DOB: [birth year], 43 y.o.   MRN: [id number]     SURGICAL PROGRESS NOTE   Hospital Day(s): 2.   Interval History: Patient seen and examined, no acute events or new complaints overnight. Patient reports having spasm in the abdomen.  This is making her feel very uncomfortable.  Denies any nausea or vomiting.  Hemoglobin 13, no decrease in hemoglobin.  No sign of bleeding.  Vital signs in last 24 hours: [min-max] current  Temp:  [97.3 F (36.3 C)-98.2 F (36.8 C)] 97.5 F (36.4 C) (02/24 1650) Pulse Rate:  [89-100] 89 (02/24 1650) Resp:  [15-19] 17 (02/24 1650) BP: (101-140)/(64-91) 119/91 (02/24 1650) SpO2:  [98 %-100 %] 100 % (02/24 1650) Weight:  [70.3 kg-72.5 kg] 72.5 kg (02/25 0500)     Height: 6' (182.9 cm) Weight: 72.5 kg BMI (Calculated): 21.67   Physical Exam:  Constitutional: alert, cooperative and no distress  Respiratory: breathing non-labored at rest  Cardiovascular: regular rate and sinus rhythm  Gastrointestinal: soft, mild-tender, and non-distended.  Gastrostomy in place.  No sign of leakage or complication.  Labs:     Latest Ref Rng & Units 06/08/2023    3:08 AM 06/07/2023    4:14 AM 06/06/2023   12:54 AM  CBC  WBC 4.0 - 10.5 K/uL 9.4  8.8  8.9   Hemoglobin 13.0 - 17.0 g/dL 86.5  78.4  69.6   Hematocrit 39.0 - 52.0 % 38.7  37.8  36.5   Platelets 150 - 400 K/uL 329  240  256       Latest Ref Rng & Units 06/08/2023    3:08 AM 06/07/2023    4:14 AM 06/04/2023   12:20 PM  CMP  Glucose 70 - 99 mg/dL 295  99  89   BUN 6 - 20 mg/dL 15  15  13    Creatinine 0.61 - 1.24 mg/dL 2.84  1.32  4.40   Sodium 135 - 145 mmol/L 136  137  135   Potassium 3.5 - 5.1 mmol/L 5.0  4.3  4.3   Chloride 98 - 111 mmol/L 99  100  98   CO2 22 - 32 mmol/L 28  27  25    Calcium 8.9 - 10.3 mg/dL 9.5  9.3  9.0   Total Protein 6.5 - 8.1 g/dL   6.9   Total Bilirubin 0.0 - 1.2 mg/dL   0.3   Alkaline Phos 38 - 126 U/L   104   AST 15 - 41 U/L   20   ALT 0 - 44 U/L    14     Imaging studies: No new pertinent imaging studies   Assessment/Plan:  43 y.o. male with obstructive esophageal mass 1 Day Post-Op s/p gastrostomy tube placement, liver biopsy and Chemo-Port placement, complicated by pertinent comorbidities including stage IV esophageal cancer.  -Patient uncomfortable due to muscle spasms.  Will optimize pain medication with muscle relaxer and pregabalin.  Will also add NSAID for 24 hours -Continue morphine and oxycodone as needed -May start low rate feeding through the gastrostomy tube and assess for toleration -Watch out for refeeding syndrome -No contraindication to use Chemo-Port from a standpoint -Continue medical management as per primary team and medical oncology  Gae Gallop, MD

## 2023-06-08 NOTE — Progress Notes (Signed)
 Progress Note   Patient: Tyler Stanley ZOX:[id number] DOB: 28-Feb-1981 DOA: 06/04/2023     2 DOS: the patient was seen and examined on 06/08/2023   Brief hospital course: SUYASH AMORY is a 43 y.o. male with medical history significant of ADHD, scented with worsening of left calf pain. Also endorses  frequent episodes of nauseous throwing up especially after meal, feels food stuck in the middle of the chest, followed by throwing up of undigested food, and he frequently vomited clear secretions. Lost significant amount of weight.  DVT study positive for acute DVT in left calf to mid femoral vein.  Patient is started on IV heparin drip, no vascular surgery recommended.  GI evaluation called, EGD is highly concerning for malignant esophageal stricture at the GE junction and distal esophagus. Pending path report. Oncology evaluation called.  CT chest/ abdomen/ pelvis showed thickening of the distal esophagus consistent with primary esophageal adenocarcinoma. Suspicious lymph nodes adjacent to the distal esophagus and in the subcarinal nodal station. Multiple hypoenhancing lesions in the liver consistent with hepatic metastasis. Probable metastatic adenopathy in the gastrohepatic ligament and celiac nodal stations.  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 Pending pathology. Causing intractable nausea and vomiting. He is tolerating minimal oral fluids. Seen by Oncology team - PEG tube, port placed by Dr. Everlene Farrier. Radiation therapy on board to start RT for palliation. Palliative care on board. PEG feeds started today per dietician. Continue to monitor electrolytes as he is at risk for refeeding syndrome. If he tolerates PEG feeds well, possible discharge with oncology follow up. Continue pain control with morphine for severe pain, oxy for moderate pain.  Acute nonprovoked left lower extremity  DVT Vascular surgery reviewed the case and recommend no embolectomy.  Heparin to be transitioned to Eliquis today.   Tobacco abuse Marijuana use Urine drug screen pending. Advised smoking cessation. Nicotine patch ordered.    Nursing supportive care. Fall, aspiration precautions. DVT prophylaxis   Code Status: Full Code  Subjective: Patient is seen and examined today morning. He is sleeping comfortable. PEG feeds started. Denies pain. Wishes to go home. Advised that if he tolerates PEG feeds plan to dc him.  Physical Exam: Vitals:   06/07/23 1615 06/07/23 1650 06/08/23 0500 06/08/23 0830  BP: 112/82 (!) 119/91  130/86  Pulse: 91 89  96  Resp: 19 17  16   Temp: (!) 97.3 F (36.3 C) (!) 97.5 F (36.4 C)  98.2 F (36.8 C)  TempSrc:  Oral  Oral  SpO2: 100% 100%  97%  Weight:   72.5 kg   Height:        General - Young  Caucasian thin built male, no acute distress. HEENT - PERRLA, EOMI, atraumatic head, non tender sinuses. Lung - Clear, no rales, rhonchi, wheezes. Heart - S1, S2 heard, no murmurs, rubs, no pedal edema. Abdomen - Soft, non tender, PEG tube intact. Neuro - Alert, awake and oriented x 3, non focal exam. Skin - Warm and dry. Left calf swelling improved.  Data Reviewed:      Latest Ref Rng & Units 06/08/2023    3:08 AM 06/07/2023    4:14 AM 06/06/2023   12:54 AM  CBC  WBC 4.0 - 10.5 K/uL 9.4  8.8  8.9   Hemoglobin 13.0 - 17.0 g/dL 81.1  91.4  78.2   Hematocrit 39.0 - 52.0 % 38.7  37.8  36.5   Platelets 150 - 400  K/uL 329  240  256       Latest Ref Rng & Units 06/08/2023    3:08 AM 06/07/2023    4:14 AM 06/04/2023   12:20 PM  BMP  Glucose 70 - 99 mg/dL 016  99  89   BUN 6 - 20 mg/dL 15  15  13    Creatinine 0.61 - 1.24 mg/dL 0.10  9.32  3.55   Sodium 135 - 145 mmol/L 136  137  135   Potassium 3.5 - 5.1 mmol/L 5.0  4.3  4.3   Chloride 98 - 111 mmol/L 99  100  98   CO2 22 - 32 mmol/L 28  27  25    Calcium 8.9 - 10.3 mg/dL 9.5  9.3  9.0    DG Chest Port  1 View Result Date: 06/07/2023 CLINICAL DATA:  Port-A-Cath placement EXAM: PORTABLE CHEST 1 VIEW COMPARISON:  06/06/2023 FINDINGS: Single frontal view of the chest demonstrates right chest wall port via internal jugular approach, tip overlying the right atrium. The cardiac silhouette is unremarkable. No acute airspace disease, effusion, or pneumothorax. Mild background emphysema. There is free gas beneath the right hemidiaphragm, new since prior CT. No acute bony abnormalities. IMPRESSION: 1. New pneumoperitoneum beneath the right hemidiaphragm. According to the surgeon, a percutaneous gastrostomy tube was placed at the time of the chest wall port, accounting for the free gas. 2. Right chest wall port, tip overlying the right atrium. No evidence of pneumothorax. These results were called by telephone at the time of interpretation on 06/07/2023 at 4:17 pm to provider [facility] CINTRON-DIAZ , who verbally acknowledged these results. Electronically Signed   By: Sharlet Salina M.D.   On: 06/07/2023 16:23   DG C-Arm 1-60 Min-No Report Result Date: 06/07/2023 Fluoroscopy was utilized by the requesting physician.  No radiographic interpretation.   CT CHEST ABDOMEN PELVIS W CONTRAST Result Date: 06/06/2023 CLINICAL DATA:  Deep venous thrombosis. Concern for metastatic disease. 43 year old male. Esophageal mass on endoscopy. * Tracking Code: BO * EXAM: CT CHEST, ABDOMEN, AND PELVIS WITH CONTRAST TECHNIQUE: Multidetector CT imaging of the chest, abdomen and pelvis was performed following the standard protocol during bolus administration of intravenous contrast. RADIATION DOSE REDUCTION: This exam was performed according to the departmental dose-optimization program which includes automated exposure control, adjustment of the mA and/or kV according to patient size and/or use of iterative reconstruction technique. CONTRAST:  80mL OMNIPAQUE IOHEXOL 300 MG/ML  SOLN COMPARISON:  None Available. FINDINGS: CT CHEST FINDINGS  Cardiovascular: No significant vascular findings. Normal heart size. No pericardial effusion. No PE identified. Mediastinum/Nodes: Thickening through the distal esophagus leading up to the GE junction over approximately 4 cm. Lymph node adjacent to the distal esophagus measures 10 mm on image 45/2. Subcarinal lymph node is also suspicious measuring 12 mm. Lungs/Pleura: No suspicious pulmonary nodules. Normal pleural. Airways normal. Musculoskeletal: No aggressive osseous lesion. CT ABDOMEN AND PELVIS FINDINGS Hepatobiliary: Multiple round hypoenhancing lesions within LEFT RIGHT hepatic lobe. Conglomerate cluster of lesions in the posterior RIGHT hepatic lobe measures 6.75.6 cm on image 58. A discrete lesion in the central LEFT hepatic lobe measures 17 mm on 55/2. There approximately 20 lesions in liver. Gallbladder normal. Pancreas: Pancreas is normal. No ductal dilatation. No pancreatic inflammation. Spleen: Normal spleen Adrenals/urinary tract: Adrenal glands and kidneys are normal. The ureters and bladder normal. Stomach/Bowel: Stomach, small bowel, appendix, and cecum are normal. The colon and rectosigmoid colon are normal. Vascular/Lymphatic: Abdominal aorta is normal caliber. Small nodule in the gastrohepatic  ligament and celiac nodal stations are suspicious given the hepatic metastasis. Reproductive: Prostate unremarkable Other: No peritoneal metastasis Musculoskeletal: No aggressive osseous lesion. 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 IMPRESSION: CHEST: 1. 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 2. Suspicious lymph nodes adjacent to the distal esophagus and in the subcarinal nodal station. 3. No pulmonary metastasis. PELVIS: 1. Multiple hypoenhancing lesions in the liver consistent with hepatic metastasis. 2. Probable metastatic adenopathy in the gastrohepatic ligament and celiac nodal stations. 3. No other evidence of metastatic disease in  the abdomen pelvis. Electronically Signed   By: Genevive Bi M.D.   On: 06/06/2023 17:31    Family Communication: Discussed with patient and mother at bedside, they understand and agree. All questions answereed.  Disposition: Status is: Inpatient: new metastatic GE junction mass, unable to tolerate diet. Peg tube feeds  Planned Discharge Destination: Home    Time spent: 41 minutes  Author: Marcelino Duster, MD 06/08/2023 11:49 AM Secure chat 7am to 7pm For on call review [email or web address].

## 2023-06-08 NOTE — Anesthesia Postprocedure Evaluation (Signed)
 Anesthesia Post Note  Patient: Tyler Stanley  Procedure(s) Performed: INSERTION PORT-A-CATH (Right) INSERTION OF GASTROSTOMY TUBE  Patient location during evaluation: PACU Anesthesia Type: General Level of consciousness: awake and alert Pain management: pain level controlled Vital Signs Assessment: post-procedure vital signs reviewed and stable Respiratory status: spontaneous breathing, nonlabored ventilation, respiratory function stable and patient connected to nasal cannula oxygen Cardiovascular status: blood pressure returned to baseline and stable Postop Assessment: no apparent nausea or vomiting Anesthetic complications: no   No notable events documented.   Last Vitals:  Vitals:   06/07/23 1650 06/08/23 0830  BP: (!) 119/91 130/86  Pulse: 89 96  Resp: 17 16  Temp: (!) 36.4 C 36.8 C  SpO2: 100% 97%    Last Pain:  Vitals:   06/08/23 0830  TempSrc: Oral  PainSc:                  Stephanie Coup

## 2023-06-08 NOTE — Consult Note (Signed)
 NEW PATIENT EVALUATION  Name: Tyler Stanley  MRN: [id number]  Date:   06/04/2023     DOB: [birth year]   This 43 y.o. male patient presents to the clinic for initial evaluation of stage IV distal esophageal carcinoma.  REFERRING PHYSICIAN: No ref. provider found  CHIEF COMPLAINT:  Chief Complaint  Patient presents with   Leg Pain    DIAGNOSIS: The encounter diagnosis was Acute deep vein thrombosis (DVT) of left lower extremity, unspecified vein (HCC).   PREVIOUS INVESTIGATIONS:  CT scan reviewed Pathology reports were pending. Clinical notes reviewed  HPI: Patient is a 43 year old male who presented to the ER with left calf pain and swelling found to have a DVT on Doppler.  He was noted to have significant progressive dysphagia and was seen by G and underwent upper endoscopy showing large submucosal friable ulcerating mass with bleeding in the distal esophagus and GE junction 40 cm from the incisors.  He has been biopsied although pathology is pending.  CT scan of chest abdomen pelvis showed thickening of the distal esophagus consistent with primary esophageal adenocarcinoma.  He also has suspicious lymph nodes adjacent to the distal esophagus and in the subcarinal region.  He also was noted to have multiple hypoenhancing lesions in the liver consistent with metastatic disease making him stage IV.  He also had probable metastatic adenopathy in the gastrohepatic ligament and celiac nodal stations.  Patient has had a PEG tube placed.  He is seen today for consideration of palliative radiation therapy to his esophagus.  He is weak seen in his hospital room.  PLANNED TREATMENT REGIMEN: Palliative radiation therapy  PAST MEDICAL HISTORY:  has a past medical history of ADHD.    PAST SURGICAL HISTORY:  Past Surgical History:  Procedure Laterality Date   BIOPSY  06/06/2023   Procedure: BIOPSY;  Surgeon: Toney Reil, MD;  Location: [facility] ENDOSCOPY;  Service: Gastroenterology;;    ESOPHAGOGASTRODUODENOSCOPY (EGD) WITH PROPOFOL N/A 06/06/2023   Procedure: ESOPHAGOGASTRODUODENOSCOPY (EGD) WITH PROPOFOL;  Surgeon: Toney Reil, MD;  Location[facility] ENDOSCOPY;  Service: Gastroenterology;  Laterality: N/A;    FAMILY HISTORY: family history is not on file.  SOCIAL HISTORY:  reports that he has been smoking cigarettes. He has never used smokeless tobacco. He reports that he does not currently use alcohol. He reports that he does not currently use drugs after having used the following drugs: Marijuana.  ALLERGIES: Patient has no known allergies.  MEDICATIONS:  Current Facility-Administered Medications  Medication Dose Route Frequency Provider Last Rate Last Admin   acetaminophen (TYLENOL) tablet 650 mg  650 mg Oral Q6H PRN Carolan Shiver, MD       Or   acetaminophen (TYLENOL) suppository 650 mg  650 mg Rectal Q6H PRN Carolan Shiver, MD       docusate (COLACE) 50 MG/5ML liquid 100 mg  100 mg Oral BID Marcelino Duster, MD       feeding supplement (ENSURE ENLIVE / ENSURE PLUS) liquid 237 mL  237 mL Oral BID BM Carolan Shiver, MD   237 mL at 06/08/23 0803   [START ON 06/09/2023] feeding supplement (OSMOLITE 1.5 CAL) liquid 120 mL  120 mL Per Tube 6 X Daily Marcelino Duster, MD       [START ON 06/10/2023] feeding supplement (OSMOLITE 1.5 CAL) liquid 237 mL  237 mL Per Tube 6 X Daily Sreeram, Narendranath, MD       feeding supplement (OSMOLITE 1.5 CAL) liquid 80 mL  80 mL Per Tube 6 X  Daily Marcelino Duster, MD       free water 100 mL  100 mL Per Tube 6 X Daily Marcelino Duster, MD       heparin ADULT infusion 100 units/mL (25000 units/258mL)  2,150 Units/hr Intravenous Continuous Marcelino Duster, MD 21.5 mL/hr at 06/08/23 1200 2,150 Units/hr at 06/08/23 1200   ketorolac (TORADOL) 30 MG/ML injection 30 mg  30 mg Intravenous Q6H Carolan Shiver, MD   30 mg at 06/08/23 0805   methocarbamol (ROBAXIN) tablet 500 mg  500 mg Oral TID  Carolan Shiver, MD   500 mg at 06/08/23 0802   morphine (PF) 4 MG/ML injection 4 mg  4 mg Intravenous Q4H PRN Carolan Shiver, MD   4 mg at 06/08/23 0126   nicotine (NICODERM CQ - dosed in mg/24 hours) patch 14 mg  14 mg Transdermal Daily Carolan Shiver, MD   14 mg at 06/08/23 4098   nicotine polacrilex (NICORETTE) gum 2 mg  2 mg Oral PRN Carolan Shiver, MD   2 mg at 06/05/23 2120   ondansetron [facility]) injection 4 mg  4 mg Intravenous Q6H PRN Carolan Shiver, MD   4 mg at 06/07/23 2136   oxyCODONE (Oxy IR/ROXICODONE) immediate release tablet 5 mg  5 mg Oral Q4H PRN Carolan Shiver, MD   5 mg at 06/08/23 0441   pantoprazole (PROTONIX) injection 40 mg  40 mg Intravenous Q12H Carolan Shiver, MD   40 mg at 06/08/23 1191   polyethylene glycol (MIRALAX / GLYCOLAX) packet 17 g  17 g Per Tube Daily Marcelino Duster, MD       pregabalin (LYRICA) capsule 100 mg  100 mg Oral TID Carolan Shiver, MD   100 mg at 06/08/23 0801   sennosides (SENOKOT) 8.8 MG/5ML syrup 5 mL  5 mL Oral QHS Sreeram, Narendranath, MD       sucralfate (CARAFATE) 1 GM/10ML suspension 1 g  1 g Oral TID WC & HS Carolan Shiver, MD   1 g at 06/08/23 0805   [START ON 06/09/2023] thiamine (VITAMIN B1) tablet 100 mg  100 mg Per Tube Daily Marcelino Duster, MD        ECOG PERFORMANCE STATUS:  3 - Symptomatic, >50% confined to bed  REVIEW OF SYSTEMS: Patient denies any weight loss, fatigue, weakness, fever, chills or night sweats. Patient denies any loss of vision, blurred vision. Patient denies any ringing  of the ears or hearing loss. No irregular heartbeat. Patient denies heart murmur or history of fainting. Patient denies any chest pain or pain radiating to her upper extremities. Patient denies any shortness of breath, difficulty breathing at night, cough or hemoptysis. Patient denies any swelling in the lower legs. Patient denies any nausea vomiting, vomiting of blood, or  coffee ground material in the vomitus. Patient denies any stomach pain. Patient states has had normal bowel movements no significant constipation or diarrhea. Patient denies any dysuria, hematuria or significant nocturia. Patient denies any problems walking, swelling in the joints or loss of balance. Patient denies any skin changes, loss of hair or loss of weight. Patient denies any excessive worrying or anxiety or significant depression. Patient denies any problems with insomnia. Patient denies excessive thirst, polyuria, polydipsia. Patient denies any swollen glands, patient denies easy bruising or easy bleeding. Patient denies any recent infections, allergies or URI. Patient "s visual fields have not changed significantly in recent time.   PHYSICAL EXAM: BP 130/86 (BP Location: Left Arm)   Pulse 96   Temp 98.2 F (36.8 C) (  Oral)   Resp 16   Ht 6' (1.829 m)   Wt 159 lb 13.3 oz (72.5 kg)   SpO2 97%   BMI 21.68 kg/m  [place] male with cachexia in NAD.  Well-developed well-nourished patient in NAD. HEENT reveals PERLA, EOMI, discs not visualized.  Oral cavity is clear. No oral mucosal lesions are identified. Neck is clear without evidence of cervical or supraclavicular adenopathy. Lungs are clear to A&P. Cardiac examination is essentially unremarkable with regular rate and rhythm without murmur rub or thrill. Abdomen is benign with no organomegaly or masses noted. Motor sensory and DTR levels are equal and symmetric in the upper and lower extremities. Cranial nerves II through XII are grossly intact. Proprioception is intact. No peripheral adenopathy or edema is identified. No motor or sensory levels are noted. Crude visual fields are within normal range.  LABORATORY DATA: Pathology reports pending    RADIOLOGY RESULTS: CT scans reviewed compatible with above-stated findings   IMPRESSION: Stage IV probable adenocarcinoma the distal esophagus with liver metastasis in 43 year old male with  inability to swallow currently receiving nutrition through PEG tube for consideration of palliative radiation.  PLAN: At this time like to proceed with palliative radiation therapy to his distal esophagus.  I will use a CT scan for targeting.  Will plan on delivering 40 Gray in 20 fractions and evaluate for response.  Risks and benefits of treatment including possible dysphagia from radiation esophagitis fatigue skin reaction alteration blood counts all were reviewed with the patient.  His mother was present during my consultation.  I have personally set up and ordered CT simulation for tomorrow.  Will plan on starting his treatments first thing next week.  I would like to take this opportunity to thank you for allowing me to participate in the care of your patient.Carmina Miller, MD

## 2023-06-08 NOTE — Consult Note (Signed)
 PHARMACY - ANTICOAGULATION CONSULT NOTE  Pharmacy Consult for Heparin Indication: DVT  No Known Allergies  Patient Measurements: Height: 6' (182.9 cm) Weight: 72.5 kg (159 lb 13.3 oz) IBW/kg (Calculated) : 77.6 Heparin Dosing Weight: 70.3 kg  Vital Signs: Temp: 98.2 F (36.8 C) (02/25 0830) Temp Source: Oral (02/25 0830) BP: 130/86 (02/25 0830) Pulse Rate: 96 (02/25 0830)  Labs: Recent Labs    06/06/23 0054 06/06/23 1730 06/07/23 0414 06/07/23 0753 06/08/23 0308 06/08/23 1039  HGB 12.5*  --  12.6*  --  13.1  --   HCT 36.5*  --  37.8*  --  38.7*  --   PLT 256  --  240  --  329  --   HEPARINUNFRC 0.25*   < >  --  0.18* 0.21* 0.40  CREATININE  --   --  0.90  --  0.71  --    < > = values in this interval not displayed.    Estimated Creatinine Clearance: 122.1 mL/min (by C-G formula based on SCr of 0.71 mg/dL).   Medical History: Past Medical History:  Diagnosis Date   ADHD     Pertinent Medications:  No history of chronic anticoagulant use PTA.  Assessment: 43 y.o. male with no significant past medical history presents emergency department complaining of left calf pain for 4 to 5 days.  Patient states there has been some swelling, no history of blood clots, feels better when it is elevated.  No known injury. Vascular to see patient. Pharmacy has been consulted to initiate and monitor continuous heparin infusion.  Date/Time HL Rate  Comment 2/21 1917 <0.1 1200 units/hr Subtherapeutic 2/22 0327 0.31 1400 un/hr Therapeutic x 1 2/22 1000 0.16 1400 units/hr  Subtherapeutic 2/22 1654 0.24 1600 units/hr Subtherapeutic 2/23 0054 0.25 1700 units/hr Subtherapeutic 2/23 1730 0.19 1850 units/hr Subtherapeutic 2/24 0108 0.36 2000 un/hr Therapeutic x 1 2/24 0735  heparin drip stopped- see MAR note, for surgery/placement of - PEGtube and PortAcath for chemo 2/25 0308       0.21     2000 un/hr     SUBtherapeutic 2/25 1039 0.40  2150 un/hr Therapeutic x1   Goal of Therapy:   Heparin level 0.3-0.7 units/ml Monitor platelets by anticoagulation protocol: Yes   Plan:  2/25 1039 HL 0.40 Therapeutic x1 - Will continue drip rate at 2150 units/hr - Will check confirmatory HL in 6 hrs CBC daily  Verlyn Dannenberg A Clinical Pharmacist 06/08/2023

## 2023-06-08 NOTE — TOC Progression Note (Signed)
 Transition of Care [facility]) - Progression Note    Patient Details  Name: Tyler Stanley MRN: [id number] Date of Birth: 1980/12/02  Transition [facility] CM/SW Contact  Marlowe Sax, RN Phone Number: 06/08/2023, 3:26 PM  Clinical Narrative:     TOC will continue to follow the patient and assist with DC planning seen today for consideration of palliative radiation therapy to his esophagus.        Expected Discharge Plan and Services                                               Social Determinants of Health (SDOH) Interventions SDOH Screenings   Food Insecurity: No Food Insecurity (06/04/2023)  Housing: Unknown (06/04/2023)  Transportation Needs: No Transportation Needs (06/04/2023)  Utilities: Not At Risk (06/04/2023)  Tobacco Use: High Risk (06/07/2023)    Readmission Risk Interventions     No data to display

## 2023-06-08 NOTE — Progress Notes (Signed)
 Heparin gtt verified running 99ml/hr.

## 2023-06-08 NOTE — Progress Notes (Signed)
 PHARMACY - ANTICOAGULATION CONSULT NOTE  Pharmacy Consult for apixaban Indication: DVT  No Known Allergies  Patient Measurements: Height: 6' (182.9 cm) Weight: 72.5 kg (159 lb 13.3 oz) IBW/kg (Calculated) : 77.6 Heparin Dosing Weight:    Vital Signs: Temp: 98.2 F (36.8 C) (02/25 0830) Temp Source: Oral (02/25 0830) BP: 130/86 (02/25 0830) Pulse Rate: 96 (02/25 0830)  Labs: Recent Labs    06/06/23 0054 06/06/23 1730 06/07/23 0414 06/07/23 0753 06/08/23 0308 06/08/23 1039  HGB 12.5*  --  12.6*  --  13.1  --   HCT 36.5*  --  37.8*  --  38.7*  --   PLT 256  --  240  --  329  --   HEPARINUNFRC 0.25*   < >  --  0.18* 0.21* 0.40  CREATININE  --   --  0.90  --  0.71  --    < > = values in this interval not displayed.    Estimated Creatinine Clearance: 122.1 mL/min (by C-G formula based on SCr of 0.71 mg/dL).   Medical History: Past Medical History:  Diagnosis Date   ADHD     Medications:  Medications Prior to Admission  Medication Sig Dispense Refill Last Dose/Taking   omeprazole (PRILOSEC) 20 MG capsule Take 20 mg by mouth daily.   06/04/2023 Morning   ibuprofen (ADVIL) 800 MG tablet Take 1 tablet (800 mg total) by mouth every 8 (eight) hours as needed for moderate pain. (Patient not taking: Reported on 06/04/2023) 20 tablet 0 Not Taking   Scheduled:   apixaban  5 mg Oral BID   docusate  100 mg Oral BID   feeding supplement  237 mL Oral BID BM   [START ON 06/09/2023] feeding supplement (OSMOLITE 1.5 CAL)  120 mL Per Tube 6 X Daily   [START ON 06/10/2023] feeding supplement (OSMOLITE 1.5 CAL)  237 mL Per Tube 6 X Daily   feeding supplement (OSMOLITE 1.5 CAL)  80 mL Per Tube 6 X Daily   free water  100 mL Per Tube 6 X Daily   ketorolac  30 mg Intravenous Q6H   methocarbamol  500 mg Oral TID   nicotine  14 mg Transdermal Daily   pantoprazole (PROTONIX) IV  40 mg Intravenous Q12H   polyethylene glycol  17 g Oral Daily   pregabalin  100 mg Oral TID   sennosides  5  mL Oral QHS   [START ON 06/09/2023] thiamine  100 mg Oral Daily   Infusions:   Assessment: 43 y.o. male with no significant past medical history presents emergency department complaining of left calf pain for 4 to 5 days. Patient states there has been some swelling, no history of blood clots, feels better when it is elevated. No known injury. Vascular to see patient. Pharmacy has been consulted to transition from heparin drip to apixaban New esophageal mass/Cancer with new PEG tube 06/07/23  Goal of Therapy:  Monitor platelets by anticoagulation protocol: Yes   Plan:  Will order apixaban 5 mg per tube bid per discussion with MD concerning if need loading dose tx Heparin drip can be stopped at the time apixaban is given CBC per protocol  Bari Mantis PharmD Clinical Pharmacist 06/08/2023

## 2023-06-08 NOTE — Progress Notes (Addendum)
 Nutrition Follow Up Note   DOCUMENTATION CODES:   Severe malnutrition in context of chronic illness  INTERVENTION:   Osmolite 1.5- Give 7 cartons daily via G-tube. Flush with 50ml of water before and after each feed.   Regimen provides 2485kcal/day, 105g/day protein and 1969ml/day of free water.   Pt at high refeed risk; recommend monitor potassium, magnesium and phosphorus labs daily until stable  Thiamine 100mg  daily via tube x 7 days   Daily weights   NUTRITION DIAGNOSIS:   Severe Malnutrition related to chronic illness as evidenced by severe fat depletion, severe muscle depletion.  GOAL:   Patient will meet greater than or equal to 90% of their needs  MONITOR:   PO intake, Labs, Weight trends, TF tolerance, I & O's, Skin  REASON FOR ASSESSMENT:   Consult Enteral/tube feeding initiation and management  ASSESSMENT:   43 y/o male with h/o HTN, ADHD and marijuana use who is admitted with DVT and new GE junction/distal esophagus stricturing mass highly concerning for malignancy. Pt also noted to have liver lesions suspicous for metastasis.  -Pt s/p EGD 2/23; noted to have a partially obstructing, likely malignant esophageal tumor at the GE junction -Pt s/p surgically placed G-tube (22 Fr) and port placement 2/24  Met with pt and pt's mother in room today. Pt complaining of abdominal cramping around his G-tube insertion site. Pt reports fair appetite and oral intake at baseline but reports decreased oral intake for about 6 weeks pta. Pt reports that food has been getting stuck about midway down in his chest and he reports that he has been throwing up mostly everything he eats. Pt reports that at most, he has been getting down one meal per day. Pt reports that he has lost ~15lbs over the past 6 weeks. There is no recent documented weight history in chart but pt does appear to be down ~11lbs from his last documented weight from 2023. Pt has been drinking some Ensure in hospital  but reports he is only taking in liquids.   Pt s/p G-tube placement yesterday. Will plan to initiate tube feeds today. Goal will be for a total of seven cartons daily via tube; will plan to start with 1/3 of a carton and advance slowly to goal rate. Pt is at high refeed risk. Advised for patient to provide the tube feeds himself so that he will be comfortable with this prior to discharge. Pt is anxious to get home; discussed with pt that he will need to remain in the hospital until his refeed labs are stable and he is tolerating his tube feeds. [facility] as plan is for chemo/radiation.   Medications reviewed and include: nicotine, protonix, carafate, heparin  Labs reviewed: K 5.0 wnl, P 4.0 wnl, Mg 2.2 wnl  NUTRITION - FOCUSED PHYSICAL EXAM:  Flowsheet Row Most Recent Value  Orbital Region Moderate depletion  Upper Arm Region Severe depletion  Thoracic and Lumbar Region Severe depletion  Buccal Region Moderate depletion  Temple Region Moderate depletion  Clavicle Bone Region Severe depletion  Clavicle and Acromion Bone Region Severe depletion  Scapular Bone Region Moderate depletion  Dorsal Hand Severe depletion  Patellar Region Severe depletion  Anterior Thigh Region Severe depletion  Posterior Calf Region Severe depletion  Edema (RD Assessment) None  Hair Reviewed  Eyes Reviewed  Mouth Reviewed  Skin Reviewed  Nails Reviewed   Diet Order:   Diet Order  Diet clear liquid Fluid consistency: Thin  Diet effective now                  EDUCATION NEEDS:   Education needs have been addressed  Skin:  Skin Assessment: Reviewed RN Assessment (incisions chest and abdomen)  Last BM:  2/16  Height:   Ht Readings from Last 1 Encounters:  06/07/23 6' (1.829 m)    Weight:   Wt Readings from Last 1 Encounters:  06/08/23 72.5 kg    Ideal Body Weight:  80.9 kg  BMI:  Body mass index is 21.68  kg/m.  Estimated Nutritional Needs:   Kcal:  2200-2500kcal/day  Protein:  110-125g/day  Fluid:  2.2-2.5L/day  Betsey Holiday MS, RD, LDN If unable to be reached, please send secure chat to "RD inpatient" available from 8:00a-4:00p daily

## 2023-06-09 ENCOUNTER — Ambulatory Visit: Payer: 59

## 2023-06-09 ENCOUNTER — Other Ambulatory Visit (HOSPITAL_COMMUNITY): Payer: Self-pay

## 2023-06-09 ENCOUNTER — Telehealth (HOSPITAL_COMMUNITY): Payer: Self-pay | Admitting: Pharmacy Technician

## 2023-06-09 DIAGNOSIS — C787 Secondary malignant neoplasm of liver and intrahepatic bile duct: Secondary | ICD-10-CM | POA: Diagnosis not present

## 2023-06-09 DIAGNOSIS — Z7189 Other specified counseling: Secondary | ICD-10-CM | POA: Diagnosis not present

## 2023-06-09 DIAGNOSIS — C159 Malignant neoplasm of esophagus, unspecified: Secondary | ICD-10-CM

## 2023-06-09 DIAGNOSIS — R1312 Dysphagia, oropharyngeal phase: Secondary | ICD-10-CM | POA: Diagnosis not present

## 2023-06-09 DIAGNOSIS — I824Z2 Acute embolism and thrombosis of unspecified deep veins of left distal lower extremity: Secondary | ICD-10-CM | POA: Diagnosis not present

## 2023-06-09 LAB — CBC
HCT: 36.4 % — ABNORMAL LOW (ref 39.0–52.0)
Hemoglobin: 12.3 g/dL — ABNORMAL LOW (ref 13.0–17.0)
MCH: 29.4 pg (ref 26.0–34.0)
MCHC: 33.8 g/dL (ref 30.0–36.0)
MCV: 87.1 fL (ref 80.0–100.0)
Platelets: 339 10*3/uL (ref 150–400)
RBC: 4.18 MIL/uL — ABNORMAL LOW (ref 4.22–5.81)
RDW: 11.9 % (ref 11.5–15.5)
WBC: 7.6 10*3/uL (ref 4.0–10.5)
nRBC: 0 % (ref 0.0–0.2)

## 2023-06-09 LAB — BASIC METABOLIC PANEL
Anion gap: 7 (ref 5–15)
BUN: 25 mg/dL — ABNORMAL HIGH (ref 6–20)
CO2: 27 mmol/L (ref 22–32)
Calcium: 9.5 mg/dL (ref 8.9–10.3)
Chloride: 102 mmol/L (ref 98–111)
Creatinine, Ser: 0.86 mg/dL (ref 0.61–1.24)
GFR, Estimated: >60 mL/min (ref >60)
Glucose, Bld: 88 mg/dL (ref 70–99)
Potassium: 4.7 mmol/L (ref 3.5–5.1)
Sodium: 136 mmol/L (ref 135–145)

## 2023-06-09 LAB — PHOSPHORUS: Phosphorus: 3 mg/dL (ref 2.5–4.6)

## 2023-06-09 LAB — MAGNESIUM: Magnesium: 2.2 mg/dL (ref 1.7–2.4)

## 2023-06-09 MED ORDER — METHOCARBAMOL 500 MG PO TABS
750.0000 mg | ORAL_TABLET | Freq: Three times a day (TID) | ORAL | Status: DC
  Administered 2023-06-09 – 2023-06-10 (×3): 750 mg via ORAL
  Filled 2023-06-09 (×3): qty 2

## 2023-06-09 NOTE — Progress Notes (Signed)
 Progress Note   Patient: Tyler Stanley ZOX:[id number] DOB: 1980/07/18 DOA: 06/04/2023     3 DOS: the patient was seen and examined on 06/09/2023   Brief hospital course: 42yo with h/o ASHD who persented on 2/21 with L calf pain, found to have DVT and started on Heparin.  He also reported GI symptoms and was found to have esophageal cancer after EGD.  CT with primary esophageal adenocarcinoma with liver mets.  PEG tube placed.  Starting palliative radiation.  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 Continue pain control with morphine for severe pain, oxy for moderate pain   Acute left lower extremity DVT Very likely related to coagulopathy associated with malignancy Vascular surgery reviewed the case and recommend no embolectomy Heparin -> Eliquis   Tobacco and Marijuana use d/o Advised cessation Nicotine patch ordered  Severe malnutrition Nutrition Problem: Severe Malnutrition Etiology: chronic illness Signs/Symptoms: severe fat depletion, severe muscle depletion Interventions: Tube feeding      Consultants: GI Surgery Oncology Rad Onc Palliative care SLP Nutrition TOC team  Procedures: EGD 2/23 Port-a-Cath 2/24 PEG tube insertion 2/24  Antibiotics: None  30 Day Unplanned Readmission Risk Score    Flowsheet Row ED to Hosp-Admission (Current) from 06/04/2023 in  [facility] (1A)  30 Day Unplanned Readmission Risk Score (%) 7.35 Filed at 06/09/2023 0801       This score is the patient's risk of an unplanned readmission within 30 days of being discharged (0 -100%). The score is based on dignosis, age, lab data, medications, orders, and past utilization.   Low:  0-14.9   Medium: 15-21.9   High: 22-29.9   Extreme: 30 and above           Subjective: Having abdominal pain and soreness.  Tolerating some PO intake.  Understands the severity of his condition.   Objective: Vitals:   06/08/23 2328 06/09/23 0733  BP: 104/70 117/77  Pulse: 83 87  Resp: 18 17  Temp: 98.2 F (36.8 C) 98.2 F (36.8 C)  SpO2: 97% 97%    Intake/Output Summary (Last 24 hours) at 06/09/2023 1547 Last data filed at 06/08/2023 1921 Gross per 24 hour  Intake 700 ml  Output --  Net 700 ml   Filed Weights   06/07/23 1150 06/08/23 0500 06/09/23 0500  Weight: 70.3 kg 72.5 kg 72.8 kg    Exam:  General:  Appears calm and comfortable and is in NAD, very frail/cachectic Eyes:  EOMI, normal lids, iris ENT:  grossly normal hearing, lips & tongue, mmm Neck:  no LAD, masses or thyromegaly Cardiovascular:  RRR, no m/r/g. No LE edema.  Respiratory:   CTA bilaterally with no wheezes/rales/rhonchi.  Normal respiratory effort.  L gynecomastia Abdomen:  soft, mildly diffusely tender particularly in epigastric region; + PEG tube; honeycomb dressing in epigastric region Skin:  no rash or induration seen on limited exam Musculoskeletal:  grossly normal tone BUE/BLE, no bony abnormality; LLE edema is  significantly improved, per patient and family Psychiatric:  grossly normal mood and affect, speech fluent and appropriate, AOx3 Neurologic:  CN 2-12 grossly intact, moves all extremities in coordinated fashion  Data Reviewed: I have reviewed the patient's lab results since admission.  Pertinent labs for today include:   Unremarkable BMP, CBC      Family Communication: Wife, father and another family member were present  Disposition: Status is: Inpatient Remains inpatient appropriate because: ongoing management     Time spent: 50 minutes  Unresulted Labs (From admission, onward)     Start     Ordered   06/10/23 0500  CBC with Differential/Platelet  Tomorrow morning,   R       Question:  Specimen collection method  Answer:  Lab=Lab collect   06/09/23 1547   06/10/23 0500  Basic metabolic panel  Tomorrow morning,   R       Question:  Specimen collection method  Answer:  Lab=Lab collect   06/09/23 1547   06/08/23 0500  Magnesium  Daily,   R     Question:  Specimen collection method  Answer:  Lab=Lab collect   06/07/23 1710   06/08/23 0500  Phosphorus  Daily,   R     Question:  Specimen collection method  Answer:  Lab=Lab collect   06/07/23 1710             Author: Jonah Blue, MD 06/09/2023 3:47 PM  For on call review [email or web address].

## 2023-06-09 NOTE — Discharge Instructions (Signed)
 Your nurse navigator, Cleotilde Spadaccini, can be reached at [phone]

## 2023-06-09 NOTE — Hospital Course (Signed)
 42yo with h/o ASHD who persented on 2/21 with L calf pain, found to have DVT and started on Heparin.  He also reported GI symptoms and was found to have esophageal cancer after EGD.  CT with primary esophageal adenocarcinoma with liver mets.  PEG tube placed.  Starting palliative radiation.

## 2023-06-09 NOTE — Evaluation (Signed)
 Occupational Therapy Evaluation Patient Details Name: Tyler Stanley MRN: [id number] DOB: 08-03-1980 Today's Date: 06/09/2023   History of Present Illness   Pt is a 43 year old male who presented to the ER with left calf pain and swelling found to have a DVT on Doppler. 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 PMH of smoking, GERD.     Clinical Impressions Pt was seen for OT evaluation this date. 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 Reports IND with all tasks and no AD use and denies falls. Pt will be moving into a house on the same road as his mother and 10 minutes from his sister on [place] and sister reports they will stay with him until he is more recovered from this hospitalization. PLOF info put in for the house he will be moving into on [place].  Pt presents to acute OT demonstrating impaired ADL performance and functional mobility 2/2 weakness, mild balance deficits and low activity tolerance. Pt currently requires MOD I for bed mobility and STS. He ambulated ~20 feet with no AD in the room with CGA, then was provided RW to ambulate an additional 60 feet improving to SBA with improved stability and confidence. Pt reports mild tightness to his RLE, but no pain. SUP for SPT from bed to recliner at end of session. HR up to 116 with activity. Anticipate SUP with ADLs.  Pt would benefit from skilled OT services to address noted impairments and functional limitations (see below for any additional details) in order to maximize safety and independence while minimizing falls risk and caregiver burden. Do not anticipate the need for follow up OT services upon acute hospital DC.      If plan is discharge home, recommend the following:   A little help with walking and/or transfers;A  little help with bathing/dressing/bathroom;Help with stairs or ramp for entrance;Assist for transportation;Assistance with cooking/housework     Functional Status Assessment   Patient has had a recent decline in their functional status and demonstrates the ability to make significant improvements in function in a reasonable and predictable amount of time.     Equipment Recommendations   Other (comment) (RW)     Recommendations for Other Services         Precautions/Restrictions   Precautions Precautions: Fall Restrictions Weight Bearing Restrictions Per Provider Order: No     Mobility Bed Mobility Overal bed mobility: Modified Independent                  Transfers Overall transfer level: Modified independent                 General transfer comment: MOD I for STS and CGA for mobility with no AD, SBA with RW use      Balance Overall balance assessment: Needs assistance   Sitting balance-Leahy Scale: Normal       Standing balance-Leahy Scale: Fair Standing balance comment: CGA with no AD use, SBA with RW use                           ADL either performed or assessed with clinical judgement   ADL Overall ADL's : Needs assistance/impaired  Toilet Transfer: Radiographer, therapeutic Details (indicate cue type and reason): simulated to recliner           General ADL Comments: likely SUP for ADLs     Vision         Perception         Praxis         Pertinent Vitals/Pain Pain Assessment Pain Assessment: 0-10 Pain Score: 8  Pain Location: surgery site in chest when burping, but not all the time Pain Intervention(s): Monitored during session     Extremity/Trunk Assessment Upper Extremity Assessment Upper Extremity Assessment: Overall WFL for tasks assessed   Lower Extremity Assessment Lower Extremity Assessment: Defer to PT evaluation       Communication  Communication Communication: No apparent difficulties   Cognition Arousal: Alert Behavior During Therapy: WFL for tasks assessed/performed Cognition: No apparent impairments                               Following commands: Intact       Cueing  General Comments          Exercises Other Exercises Other Exercises: Edu on role of OT in acute setting   Shoulder Instructions      Home Living Family/patient expects to be discharged to:: Private residence Living Arrangements: Alone Available Help at Discharge: Family (family surrounding him) Type of Home: House Home Access: Stairs to enter [employer] of Steps: 5 steps unsure if can reach both at same time Entrance Stairs-Rails: Right;Left Home Layout: One level     Bathroom Shower/Tub: Chief Strategy Officer: Standard                Prior Functioning/Environment Prior Level of Function : Independent/Modified Independent;Driving             Mobility Comments: IND, denies falls ADLs Comments: IND, 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 Problem List: Decreased activity tolerance;Impaired balance (sitting and/or standing)   OT Treatment/Interventions: Self-care/ADL training;Therapeutic exercise;Therapeutic activities;DME and/or AE instruction;Energy conservation;Patient/family education;Balance training      OT Goals(Current goals can be found in the care plan section)   Acute Rehab OT Goals Patient Stated Goal: return home OT Goal Formulation: With patient Time For Goal Achievement: 06/23/23 Potential to Achieve Goals: Good ADL Goals Pt Will Perform Lower Body Bathing: with modified independence;sitting/lateral leans;sit to/from stand Pt Will Perform Lower Body Dressing: with modified independence;sitting/lateral leans;sit to/from stand Pt Will Transfer to Toilet: with modified independence;ambulating;regular height toilet Additional  ADL Goal #1: Pt will verbalize/demo use of ECS and safety strategies during ADL performance to prevent falls or overexertion to maximize return to PLOF.   OT Frequency:  Min 1X/week    Co-evaluation              AM-PAC OT "6 Clicks" Daily Activity     Outcome Measure Help from another person eating meals?: None Help from another person taking care of personal grooming?: None Help from another person toileting, which includes using toliet, bedpan, or urinal?: A Little Help from another person bathing (including washing, rinsing, drying)?: A Little Help from another person to put on and taking off regular upper body clothing?: None Help from another person to put on and taking off regular lower body clothing?: A Little 6 Click Score: 21   End of Session Equipment Utilized During Treatment: Rolling walker (  2 wheels) Nurse Communication: Mobility status  Activity Tolerance: Patient tolerated treatment well Patient left: in chair;with call bell/phone within reach;with family/visitor present  OT Visit Diagnosis: Other abnormalities of gait and mobility (R26.89)                Time: 1610-9604 OT Time Calculation (min): 16 min Charges:  OT General Charges $OT Visit: 1 Visit OT Evaluation $OT Eval Low Complexity: 1 Low Matvey Llanas, OTR/L 06/09/23, 4:07 PM  Vala Raffo E Eydan Chianese 06/09/2023, 4:04 PM

## 2023-06-09 NOTE — Telephone Encounter (Signed)
 Patient Product/process development scientist completed.    The patient is insured through [employer] and [employer].     Ran test claim for Eliquis 5 mg and the current 30 day co-pay is $4.00.   This test claim was processed through [facility]- copay amounts may vary at other pharmacies due to pharmacy/plan contracts, or as the patient moves through the different stages of their insurance plan.     Roland Earl, CPHT Pharmacy Technician III Certified Patient Advocate [facility] Patient Advocate Team Direct Number: [phone]  Fax: [phone]

## 2023-06-09 NOTE — Progress Notes (Signed)
 Referral received from palliative provider. Introduced patient to role of Statistician. Patient's "farming" friend at bedside, who stepped out for conversation/intake questions.  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   Patient shares some of the family dynamics and states he has been "just a felon" since being released in 2011 from prison for growing marijuana. Patient tearfully states, "that broke my heart." Patient also endorses feeling "shock" and "overwhelmed" at new diagnosis. He states he has been having frequent nightmares since being hospitalized. Emotional support provided. Patient agreeable to [facility] notified.  Patient does endorse some food insecurity, especially during "lull" time of farming. He typically seeks out a Programme researcher, broadcasting/film/video. He also endorses having a "second job" during the winter to help pay bills. 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 Questions/concerns passed on to palliative/oncology team.  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 He states this is NOT a usual thing for him, but he does frequently use THC.  Patient encouraged to reach out to nurse navigator as needed. Contact information placed on AVS.

## 2023-06-09 NOTE — Progress Notes (Signed)
 Daily Progress Note   Patient Name: Tyler Stanley       Date: 06/09/2023 DOB: 11/11/1980  Age: 43 y.o. MRN#: [id number] Attending Physician: Jonah Blue, MD Primary Care Physician: [facility] Admit Date: 06/04/2023  Reason for Consultation/Follow-up: Establishing goals of care  Subjective: Notes and labs reviewed.  Plans in place to start radiation therapy next week.  Patient will follow with med Onc.  Into see patient.  He is currently sitting in bed with family members at bedside.  He states he is tolerating his bolus tube feeds well.  He states he is having stomach cramps.  Robaxin is currently in place with dose increased from 500 to 750 mg.  Patient states he is eager to discharge when possible.  Recommend follow-up outpatient with Elouise Munroe NP for ongoing goals of care and symptom management in the cancer center.  Case discussed with [employer] NP    Length of Stay: 3  Current Medications: Scheduled Meds:   apixaban  5 mg Oral BID   docusate  100 mg Oral BID   feeding supplement  237 mL Oral BID BM   feeding supplement (OSMOLITE 1.5 CAL)  120 mL Per Tube 6 X Daily   [START ON 06/10/2023] feeding supplement (OSMOLITE 1.5 CAL)  237 mL Per Tube 6 X Daily   free water  100 mL Per Tube 6 X Daily   methocarbamol  500 mg Oral TID   nicotine  14 mg Transdermal Daily   pantoprazole (PROTONIX) IV  40 mg Intravenous Q12H   polyethylene glycol  17 g Oral Daily   pregabalin  100 mg Oral TID   sennosides  5 mL Oral QHS   thiamine  100 mg Oral Daily    Continuous Infusions:   PRN Meds: acetaminophen **OR** acetaminophen, morphine injection, nicotine polacrilex, ondansetron (ZOFRAN) IV, oxyCODONE  Physical Exam Pulmonary:     Effort: Pulmonary effort is normal.   Neurological:     Mental Status: He is alert.             Vital Signs: BP 117/77   Pulse 87   Temp 98.2 F (36.8 C) (Oral)   Resp 17   Ht 6' (1.829 m)   Wt 72.8 kg   SpO2 97%   BMI 21.77 kg/m  SpO2: SpO2: 97 %  O2 Device: O2 Device: Room Air O2 Flow Rate: O2 Flow Rate (L/min): 2 L/min  Intake/output summary:  Intake/Output Summary (Last 24 hours) at 06/09/2023 1210 Last data filed at 06/08/2023 1921 Gross per 24 hour  Intake 700 ml  Output 250 ml  Net 450 ml   LBM: Last BM Date : 05/30/23 (Miralax given as per MD) Baseline Weight: Weight: 70.3 kg Most recent weight: Weight: 72.8 kg    Patient Active Problem List   Diagnosis Date Noted   Protein-calorie malnutrition, severe 06/08/2023   Mass of esophagus determined by endoscopy 06/06/2023   Stricture and stenosis of esophagus 06/06/2023   Acute DVT (deep venous thrombosis) (HCC) 06/06/2023   Unintentional weight loss 06/05/2023   Nausea and vomiting 06/05/2023   Acute deep vein thrombosis (DVT) of left lower extremity (HCC) 06/05/2023   Acute deep vein thrombosis (DVT) of distal vein of left lower extremity (HCC) 06/04/2023   Dysphagia 06/04/2023   DVT (deep vein thrombosis) in pregnancy 06/04/2023   Smoking 01/24/2019   Essential hypertension 01/24/2019   Encounter to establish care 10/25/2018   Insect bite 10/25/2018   Tick bites 10/25/2018    Palliative Care Assessment & Plan    Recommendations/Plan: Goals set for full code and full scope at this time. Patient will need to follow-up with Elouise Munroe NP for ongoing goals of care discussions and symptom management in the cancer center.  Case has been discussed with him.  Code Status:    Code Status Orders  (From admission, onward)           Start     Ordered   06/04/23 1413  Full code  Continuous       Question:  By:  Answer:  Consent: discussion documented in EHR   06/04/23 1413           Code Status History     This patient has a  current code status but no historical code status.       Care plan was discussed with Elouise Munroe NP  Thank you for allowing the Palliative Medicine Team to assist in the care of this patient.    Morton Stall, NP  Please contact Palliative Medicine Team phone at [phone] for questions and concerns.

## 2023-06-09 NOTE — Progress Notes (Signed)
 PT Cancellation Note  Patient Details Name: Tyler Stanley MRN: [id number] DOB: 01-20-1981   Cancelled Treatment:    Reason Eval/Treat Not Completed: Other (comment). Pt out of room at this time, PT to re-attempt as able.   Olga Coaster PT, DPT 1:31 PM,06/09/23

## 2023-06-10 ENCOUNTER — Other Ambulatory Visit: Payer: Self-pay | Admitting: Oncology

## 2023-06-10 ENCOUNTER — Other Ambulatory Visit: Payer: 59

## 2023-06-10 DIAGNOSIS — R1312 Dysphagia, oropharyngeal phase: Secondary | ICD-10-CM | POA: Diagnosis not present

## 2023-06-10 DIAGNOSIS — I824Z2 Acute embolism and thrombosis of unspecified deep veins of left distal lower extremity: Secondary | ICD-10-CM | POA: Diagnosis not present

## 2023-06-10 DIAGNOSIS — C159 Malignant neoplasm of esophagus, unspecified: Secondary | ICD-10-CM | POA: Insufficient documentation

## 2023-06-10 LAB — CBC WITH DIFFERENTIAL/PLATELET
Abs Immature Granulocytes: 0.02 10*3/uL (ref 0.00–0.07)
Basophils Absolute: 0.1 10*3/uL (ref 0.0–0.1)
Basophils Relative: 1 %
Eosinophils Absolute: 0.2 10*3/uL (ref 0.0–0.5)
Eosinophils Relative: 3 %
HCT: 36.9 % — ABNORMAL LOW (ref 39.0–52.0)
Hemoglobin: 12.3 g/dL — ABNORMAL LOW (ref 13.0–17.0)
Immature Granulocytes: 0 %
Lymphocytes Relative: 23 %
Lymphs Abs: 1.8 10*3/uL (ref 0.7–4.0)
MCH: 29 pg (ref 26.0–34.0)
MCHC: 33.3 g/dL (ref 30.0–36.0)
MCV: 87 fL (ref 80.0–100.0)
Monocytes Absolute: 0.9 10*3/uL (ref 0.1–1.0)
Monocytes Relative: 11 %
Neutro Abs: 4.9 10*3/uL (ref 1.7–7.7)
Neutrophils Relative %: 62 %
Platelets: 357 10*3/uL (ref 150–400)
RBC: 4.24 MIL/uL (ref 4.22–5.81)
RDW: 11.9 % (ref 11.5–15.5)
WBC: 7.9 10*3/uL (ref 4.0–10.5)
nRBC: 0 % (ref 0.0–0.2)

## 2023-06-10 LAB — BASIC METABOLIC PANEL
Anion gap: 9 (ref 5–15)
BUN: 24 mg/dL — ABNORMAL HIGH (ref 6–20)
CO2: 26 mmol/L (ref 22–32)
Calcium: 9.1 mg/dL (ref 8.9–10.3)
Chloride: 96 mmol/L — ABNORMAL LOW (ref 98–111)
Creatinine, Ser: 0.81 mg/dL (ref 0.61–1.24)
GFR, Estimated: >60 mL/min (ref >60)
Glucose, Bld: 93 mg/dL (ref 70–99)
Potassium: 4.3 mmol/L (ref 3.5–5.1)
Sodium: 131 mmol/L — ABNORMAL LOW (ref 135–145)

## 2023-06-10 LAB — MAGNESIUM: Magnesium: 2.1 mg/dL (ref 1.7–2.4)

## 2023-06-10 LAB — PHOSPHORUS: Phosphorus: 3.7 mg/dL (ref 2.5–4.6)

## 2023-06-10 MED ORDER — METHOCARBAMOL 750 MG PO TABS: 750.0000 mg | ORAL_TABLET | Freq: Three times a day (TID) | ORAL | 0 refills | Status: DC

## 2023-06-10 MED ORDER — DEXAMETHASONE 4 MG PO TABS: 8.0000 mg | ORAL_TABLET | Freq: Every day | ORAL | 1 refills | Status: DC

## 2023-06-10 MED ORDER — SENNOSIDES 8.8 MG/5ML PO SYRP: 5.0000 mL | ORAL_SOLUTION | Freq: Every day | ORAL | 0 refills | Status: DC

## 2023-06-10 MED ORDER — FREE WATER: 100.0000 mL | Freq: Every day | 0 refills | Status: DC

## 2023-06-10 MED ORDER — OSMOLITE 1.5 CAL PO LIQD: 237.0000 mL | Freq: Every day | ORAL | 0 refills | Status: DC

## 2023-06-10 MED ORDER — DOCUSATE SODIUM 50 MG/5ML PO LIQD: 100.0000 mg | Freq: Two times a day (BID) | ORAL | 0 refills | Status: DC

## 2023-06-10 MED ORDER — ACETAMINOPHEN 325 MG PO TABS: 650.0000 mg | ORAL_TABLET | Freq: Four times a day (QID) | ORAL | Status: DC | PRN

## 2023-06-10 MED ORDER — NICOTINE POLACRILEX 2 MG MT GUM: 2.0000 mg | CHEWING_GUM | OROMUCOSAL | 0 refills | Status: DC | PRN

## 2023-06-10 MED ORDER — LIDOCAINE-PRILOCAINE 2.5-2.5 % EX CREA: TOPICAL_CREAM | CUTANEOUS | 3 refills | Status: DC

## 2023-06-10 MED ORDER — APIXABAN 5 MG PO TABS: 5.0000 mg | ORAL_TABLET | Freq: Two times a day (BID) | ORAL | 0 refills | Status: DC

## 2023-06-10 MED ORDER — PROCHLORPERAZINE MALEATE 10 MG PO TABS: 10.0000 mg | ORAL_TABLET | Freq: Four times a day (QID) | ORAL | 1 refills | Status: DC | PRN

## 2023-06-10 MED ORDER — PREGABALIN 100 MG PO CAPS: 100.0000 mg | ORAL_CAPSULE | Freq: Three times a day (TID) | ORAL | 0 refills | Status: DC

## 2023-06-10 MED ORDER — ONDANSETRON HCL 8 MG PO TABS
8.0000 mg | ORAL_TABLET | Freq: Three times a day (TID) | ORAL | 1 refills | Status: DC | PRN
Start: 2023-06-10 — End: 2023-11-19

## 2023-06-10 MED ORDER — OXYCODONE HCL 5 MG PO TABS: 5.0000 mg | ORAL_TABLET | ORAL | 0 refills | Status: DC | PRN

## 2023-06-10 MED ORDER — POLYETHYLENE GLYCOL 3350 17 G PO PACK: 17.0000 g | PACK | Freq: Every day | ORAL | 0 refills | Status: DC

## 2023-06-10 MED ORDER — THIAMINE HCL 100 MG PO TABS: 100.0000 mg | ORAL_TABLET | Freq: Every day | ORAL | 0 refills | Status: AC

## 2023-06-10 MED ORDER — NICOTINE 14 MG/24HR TD PT24: 14.0000 mg | MEDICATED_PATCH | Freq: Every day | TRANSDERMAL | 0 refills | Status: DC

## 2023-06-10 MED ORDER — ENSURE ENLIVE PO LIQD: 237.0000 mL | Freq: Two times a day (BID) | ORAL | 12 refills | Status: DC

## 2023-06-10 NOTE — Progress Notes (Signed)
 Pharmacist Chemotherapy Monitoring - Initial Assessment    Anticipated start date: 06/16/23   The following has been reviewed per standard work regarding the patient's treatment regimen: The patient's diagnosis, treatment plan and drug doses, and organ/hematologic function Lab orders and baseline tests specific to treatment regimen  The treatment plan start date, drug sequencing, and pre-medications Prior authorization status  Patient's documented medication list, including drug-drug interaction screen and prescriptions for anti-emetics and supportive care specific to the treatment regimen The drug concentrations, fluid compatibility, administration routes, and timing of the medications to be used The patient's access for treatment and lifetime cumulative dose history, if applicable  The patient's medication allergies and previous infusion related reactions, if applicable   Changes made to treatment plan:  N/A  Follow up needed:  N/A   Sharen Hones, PharmD, BCPS Clinical Pharmacist   06/10/2023  2:01 PM

## 2023-06-10 NOTE — TOC Initial Note (Signed)
 Transition of Care [facility]) - Initial/Assessment Note    Patient Details  Name: Tyler Stanley MRN: [id number] Date of Birth: April 27, 1980  Transition of Care [facility]) CM/SW Contact:    Marlowe Sax, RN Phone Number: 06/10/2023, 10:26 AM  Clinical Narrative:                 Spoke with the patient's mother who called me on the phone, she stated that the patient will be living next door to her at [address] she stated that his house wont be ready until Sat for him to move in, She asked for [employer]'s number to call them and arrange meal delivery, I provided her the [place] phone number I explained that since he is getting cancer treatment it is not likely that he could go to STR plus he is able to walk further than a household distance so Ins will not likely cover it, She stated that he needs somewhere to stay at Discharge for a few days, I asked if he could stay with her and she stated due to his extensive history or drug use and theft that he could not stay with her, I explained to her that we can not send patient's to STR just to have a place to stay.  She stated understanding The patient has no DME needs and was able to ambulate 300 ft without AD with therapy   Expected Discharge Plan: Home/Self Care Barriers to Discharge: Continued Medical Work up   Patient Goals and CMS Choice            Expected Discharge Plan and Services   Discharge Planning Services: CM Consult   Living arrangements for the past 2 months: Single Family Home                 DME Arranged: N/A DME Agency: NA                  Prior Living Arrangements/Services Living arrangements for the past 2 months: Single Family Home Lives with:: Self Patient language and need for interpreter reviewed:: Yes Do you feel safe going back to the place where you live?: Yes      Need for Family Participation in Patient Care: No (Comment) Care giver support system in place?: No (comment)   Criminal Activity/Legal  Involvement Pertinent to Current Situation/Hospitalization: No - Comment as needed  Activities of Daily Living   ADL Screening (condition at time of admission) Independently performs ADLs?: Yes (appropriate for developmental age) Is the patient deaf or have difficulty hearing?: No Does the patient have difficulty seeing, even when wearing glasses/contacts?: No Does the patient have difficulty concentrating, remembering, or making decisions?: No  Permission Sought/Granted   Permission granted to share information with : Yes, Verbal Permission Granted              Emotional Assessment Appearance:: Appears stated age Attitude/Demeanor/Rapport: Engaged Affect (typically observed): Accepting Orientation: : Oriented to Self, Oriented to Place, Oriented to  Time, Oriented to Situation Alcohol / Substance Use: Illicit Drugs Psych Involvement: No (comment)  Admission diagnosis:  DVT (deep vein thrombosis) in pregnancy [O22.30] Acute deep vein thrombosis (DVT) of left lower extremity, unspecified vein (HCC) [I82.402] Acute DVT (deep venous thrombosis) (HCC) [I82.409] Patient Active Problem List   Diagnosis Date Noted   Esophageal cancer, stage IV (HCC) 06/10/2023   Protein-calorie malnutrition, severe 06/08/2023   Mass of esophagus determined by endoscopy 06/06/2023   Stricture and stenosis of esophagus 06/06/2023  Acute DVT (deep venous thrombosis) (HCC) 06/06/2023   Unintentional weight loss 06/05/2023   Nausea and vomiting 06/05/2023   Acute deep vein thrombosis (DVT) of left lower extremity (HCC) 06/05/2023   Acute deep vein thrombosis (DVT) of distal vein of left lower extremity (HCC) 06/04/2023   Dysphagia 06/04/2023   DVT (deep vein thrombosis) in pregnancy 06/04/2023   Smoking 01/24/2019   Essential hypertension 01/24/2019   Encounter to establish care 10/25/2018   Insect bite 10/25/2018   Tick bites 10/25/2018   PCP:  [facility] Pharmacy:   [id number] [place], [place] - [address] [place] [address] Phone: [phone] Fax: [phone]     Social Drivers of Health (SDOH) Social History: SDOH Screenings   Food Insecurity: No Food Insecurity (06/04/2023)  Housing: Unknown (06/04/2023)  Transportation Needs: No Transportation Needs (06/04/2023)  Utilities: Not At Risk (06/04/2023)  Tobacco Use: High Risk (06/07/2023)   SDOH Interventions:     Readmission Risk Interventions     No data to display

## 2023-06-10 NOTE — Progress Notes (Signed)
 START ON PATHWAY REGIMEN - Gastroesophageal     A cycle is every 14 days:     Oxaliplatin      Leucovorin      Fluorouracil      Fluorouracil   **Always confirm dose/schedule in your pharmacy ordering system**  Patient Characteristics: Distant Metastases (cM1/pM1) / Locally Recurrent Disease, Adenocarcinoma - Esophageal, GE Junction, and Gastric, First Line, HER2 Negative/Unknown, PD?L1 Expression CPS < 5/Negative/Unknown, MSS/pMMR or MSI Unknown, CLDN18.2 Negative/Unknown Therapeutic Status: Distant Metastases (No Additional Staging) Histology: Adenocarcinoma Disease Classification: GE Junction Line of Therapy: First Line HER2 Status: Awaiting Test Results PD-L1 Expression Status: Awaiting Test Results Microsatellite/Mismatch Repair Status: Unknown CLDN18.2 Status: Awaiting Test Results Intent of Therapy: Non-Curative / Palliative Intent, Discussed with Patient

## 2023-06-10 NOTE — Progress Notes (Signed)
 Physical Therapy Evaluation Patient Details Name: Tyler Stanley MRN: [id number] DOB: October 14, 1980 Today's Date: 06/10/2023  History of Present Illness  Pt is a 43 year old male who presented to the ER with left calf pain and swelling found to have a DVT on Doppler. 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 PMH of smoking, GERD.    Clinical Impression  Orders Received. Chart Reviewed. Patient received in bathroom upon PT arrival. Agreeable to PT evaluation. 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 Patient was IND with mobility/ADLs, no AD. Denies falls. Per patient reports, at discharge will be moving into home, down the road from his mother. Will have assistance from them as needed.   Upon evaluation, patient was Mod I for all aspects of bed mobility, transfers and ambulation. Patient initially ambulating approx 20 ft with use of RW, removed as patient has good stability without use of AD at this time. Patient able to ambulate ~ 300 ft no AD and negotiate 8 stairs without rails with Mod I. Mild discomfort at incision site, but denies pain in RLE. Overall patient tolerating all activities well. Patient left in room with nursing at bedside. Patient seems to be at functional baseline for mobility at this time, no skilled acute PT services needed. Do not anticipate need for PT services upon acute discharge. Will discharge in house.        If plan is discharge home, recommend the following: Assist for transportation   Can travel by private vehicle        Equipment Recommendations None recommended by PT  Recommendations for Other Services       Functional Status Assessment Patient has not had a recent decline in their functional status     Precautions / Restrictions Precautions Precautions:  None Restrictions Weight Bearing Restrictions Per Provider Order: No      Mobility  Bed Mobility Overal bed mobility: Independent             General bed mobility comments: able to transition sit <> supine IND level    Transfers Overall transfer level: Modified independent Equipment used: None               General transfer comment: Mod I multiple STS from various height surfaces, no AD use    Ambulation/Gait Ambulation/Gait assistance: Modified independent (Device/Increase time) Gait Distance (Feet): 300 Feet Assistive device: Rolling walker (2 wheels), None Gait Pattern/deviations: WFL(Within Functional Limits)       General Gait Details: Initial 20 ft completed with RW, no need for AD therefore removed, patietn able to ambulate rest of distance without AD, good stability, no overt LOB or veering noted. Pt tolerating well.  Stairs Stairs: Yes Stairs assistance: Modified independent (Device/Increase time) Stair Management: No rails Number of Stairs: 8 General stair comments: able to ascend/descend stairs without use of rails x 8 steps, no imbalance. Mod I  Wheelchair Mobility     Tilt Bed    Modified Rankin (Stroke Patients Only)       Balance Overall balance assessment: Modified Independent   Sitting balance-Leahy Scale: Normal       Standing balance-Leahy Scale: Normal Standing balance comment: Normal Balance, No imbalance without UE support  Pertinent Vitals/Pain Pain Assessment Pain Assessment: 0-10 Pain Score: 2  Pain Location: surgical site Pain Descriptors / Indicators: Tender Pain Intervention(s): Limited activity within patient's tolerance, Monitored during session    Home Living Family/patient expects to be discharged to:: Private residence Living Arrangements: Alone Available Help at Discharge: Family Type of Home: House Home Access: Stairs to enter Entrance Stairs-Rails: Right;Left  (unsure if can reach both) Entrance Stairs-Number of Steps: 5   Home Layout: One level Home Equipment: Other (comment) (reports mother has equipment he can utilize including RW) Additional Comments: Pt will be moving into a home down road from mother; Home Environment info entered based upon home that patient reports will be discharging too.    Prior Function Prior Level of Function : Independent/Modified Independent;Driving             Mobility Comments: IND, denies falls ADLs Comments: IND, works on farm     Extremity/Trunk Assessment   Upper Extremity Assessment Upper Extremity Assessment: Overall WFL for tasks assessed    Lower Extremity Assessment Lower Extremity Assessment: Overall WFL for tasks assessed    Cervical / Trunk Assessment Cervical / Trunk Assessment: Normal  Communication   Communication Communication: No apparent difficulties    Cognition Arousal: Alert Behavior During Therapy: WFL for tasks assessed/performed   PT - Cognitive impairments: No apparent impairments                         Following commands: Intact       Cueing       General Comments      Exercises     Assessment/Plan    PT Assessment Patient does not need any further PT services  PT Problem List         PT Treatment Interventions      PT Goals (Current goals can be found in the Care Plan section)       Frequency       Co-evaluation               AM-PAC PT "6 Clicks" Mobility  Outcome Measure Help needed turning from your back to your side while in a flat bed without using bedrails?: None Help needed moving from lying on your back to sitting on the side of a flat bed without using bedrails?: None Help needed moving to and from a bed to a chair (including a wheelchair)?: None Help needed standing up from a chair using your arms (e.g., wheelchair or bedside chair)?: None Help needed to walk in hospital room?: None Help needed climbing 3-5  steps with a railing? : None 6 Click Score: 24    End of Session   Activity Tolerance: Patient tolerated treatment well Patient left: in chair Nurse Communication: Mobility status PT Visit Diagnosis: Unsteadiness on feet (R26.81);Other abnormalities of gait and mobility (R26.89)    Time: 4098-1191 PT Time Calculation (min) (ACUTE ONLY): 14 min   Charges:   PT Evaluation $PT Eval Low Complexity: 1 Low   PT General Charges $$ ACUTE PT VISIT: 1 Visit         Creed Copper Fairly, PT, DPT 06/10/23 9:58 AM

## 2023-06-10 NOTE — Discharge Summary (Signed)
 Physician Discharge Summary   Patient: Tyler Stanley MRN: [id number] DOB: Dec 13, 1980  Admit date:     06/04/2023  Discharge date: 06/10/23  Discharge Physician: Jonah Blue   PCP: [facility]   Recommendations at discharge:   You are scheduled to start palliative radiation on Monday, 3/3 You are scheduled to start palliative chemotherapy on Wednesday, 3/5 Nutrition recommends Osmolite 1.5, 7 cartons daily via G-tube; flush with 50 mL water before and after each feed Take thiamine 100 mg daily via tube for 7 days (total) You will need repeat labs (BMP, Mag++, Phos) at your next outpatient appointment(s) Follow up with PCP in 1-2 weeks Stop using all substances!  Tobacco, alcohol, marijuana, etc.  Nicotine patch ordered. Take Eliquis twice daily every day; do not miss doses  Discharge Diagnoses: Principal Problem:   DVT (deep vein thrombosis) in pregnancy Active Problems:   Smoking   Dysphagia   Acute deep vein thrombosis (DVT) of left lower extremity (HCC)   Protein-calorie malnutrition, severe   Esophageal cancer, stage IV [facility])   Hospital Course: 43yo with h/o ASHD who persented on 2/21 with L calf pain, found to have DVT and started on Heparin.  He also reported GI symptoms and was found to have esophageal cancer after EGD.  CT with primary esophageal adenocarcinoma with liver mets.  PEG tube placed.  Starting palliative radiation.  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 concerning for metastatic esophageal cancer Pathology c/w moderately to poorly differentiated adenocarcinoma with biopsy-proven liver mets He is tolerating some oral fluids, has PEG tube now Oncology is following, planning to start chemo (FOLFOX) next Wednesday (3/5), has port Radiation therapy on board to start RT for palliation, planned for Monday, 3/3 Palliative care consulted and recommends ongoing cancer center palliative care  support   Dysphagia PEG feeds started per dietician with goal rate of 7 cartons daily via G-tube Electrolytes have been stable despite being at risk for refeeding syndrome Tolerating PEG tube feeds well Continue pain control with oxy for moderate to severe pain   Acute left lower extremity DVT Very likely related to coagulopathy associated with malignancy Vascular surgery reviewed the case and recommend no embolectomy Heparin -> Eliquis   Tobacco and Marijuana use d/o Advised cessation Nicotine patch ordered   Severe malnutrition Nutrition Problem: Severe Malnutrition Etiology: chronic illness Signs/Symptoms: severe fat depletion, severe muscle depletion Interventions: Tube feeding         Consultants: GI Surgery Oncology Rad Onc Palliative care SLP Nutrition TOC team   Procedures: EGD 2/23 Port-a-Cath 2/24 PEG tube insertion 2/24   Antibiotics: None   Pain control - [place] Controlled Substance Reporting System database was reviewed. and patient was instructed, not to drive, operate heavy machinery, perform activities at heights, swimming or participation in water activities or provide baby-sitting services while on Pain, Sleep and Anxiety Medications; until their outpatient Physician has advised to do so again. Also recommended to not to take more than prescribed Pain, Sleep and Anxiety Medications.    Disposition: Home Diet recommendation:  NPO PO as tolerated for pleasure DISCHARGE MEDICATION: Allergies as of 06/10/2023   No Known Allergies      Medication List     STOP taking these medications    ibuprofen 800 MG tablet Commonly known as: ADVIL       TAKE these medications    acetaminophen 325 MG tablet Commonly known as: TYLENOL Take 2 tablets (650 mg total) by  mouth every 6 (six) hours as needed for mild pain (pain score 1-3) (or Fever >/= 101).   apixaban 5 MG Tabs tablet Commonly known as: ELIQUIS Take 1 tablet (5 mg total) by  mouth 2 (two) times daily.   dexamethasone 4 MG tablet Commonly known as: DECADRON Take 2 tablets (8 mg total) by mouth daily. Start the day after chemotherapy for 2 days. Take with food.   docusate 50 MG/5ML liquid Commonly known as: COLACE Take 10 mLs (100 mg total) by mouth 2 (two) times daily.   feeding supplement (OSMOLITE 1.5 CAL) Liqd Place 237 mLs into feeding tube 6 (six) times daily.   feeding supplement Liqd Take 237 mLs by mouth 2 (two) times daily between meals. Start taking on: June 11, 2023   free water Soln Place 100 mLs into feeding tube 6 (six) times daily.   lidocaine-prilocaine cream Commonly known as: EMLA Apply to affected area once   methocarbamol 750 MG tablet Commonly known as: ROBAXIN Take 1 tablet (750 mg total) by mouth 3 (three) times daily.   nicotine 14 mg/24hr patch Commonly known as: NICODERM CQ - dosed in mg/24 hours Place 1 patch (14 mg total) onto the skin daily. Start taking on: June 11, 2023   nicotine polacrilex 2 MG gum Commonly known as: NICORETTE Take 1 each (2 mg total) by mouth as needed for smoking cessation.   omeprazole 20 MG capsule Commonly known as: PRILOSEC Take 20 mg by mouth daily.   ondansetron 8 MG tablet Commonly known as: Zofran Take 1 tablet (8 mg total) by mouth every 8 (eight) hours as needed for nausea or vomiting. Start on the third day after chemotherapy.   oxyCODONE 5 MG immediate release tablet Commonly known as: Oxy IR/ROXICODONE Take 1 tablet (5 mg total) by mouth every 4 (four) hours as needed for moderate pain (pain score 4-6).   polyethylene glycol 17 g packet Commonly known as: MIRALAX / GLYCOLAX Take 17 g by mouth daily. Start taking on: June 11, 2023   pregabalin 100 MG capsule Commonly known as: LYRICA Take 1 capsule (100 mg total) by mouth 3 (three) times daily.   prochlorperazine 10 MG tablet Commonly known as: COMPAZINE Take 1 tablet (10 mg total) by mouth every 6 (six)  hours as needed for nausea or vomiting.   sennosides 8.8 MG/5ML syrup Commonly known as: SENOKOT Take 5 mLs by mouth at bedtime.   thiamine 100 MG tablet Commonly known as: VITAMIN B1 Take 1 tablet (100 mg total) by mouth daily for 5 days. Start taking on: June 11, 2023               Durable Medical Equipment  (From admission, onward)           Start     Ordered   06/08/23 1148  For home use only DME Tube feeding  Once       Comments: Osmolite 1.5- Give 7 cartons daily via G-tube. Flush with 50ml of water before and after each feed.   06/08/23 1148            Discharge Exam:    Subjective: Feeling well, dressed and eager to leave.   Objective: Vitals:   06/09/23 2353 06/10/23 0747  BP: 124/87 117/76  Pulse: (!) 109 99  Resp: 18 16  Temp: 98.2 F (36.8 C) 98.7 F (37.1 C)  SpO2: 98% 96%    Intake/Output Summary (Last 24 hours) at 06/10/2023 1259 Last data filed  at 06/10/2023 1047 Gross per 24 hour  Intake 720 ml  Output --  Net 720 ml   Filed Weights   06/08/23 0500 06/09/23 0500 06/10/23 0500  Weight: 72.5 kg 72.8 kg 68.2 kg    Exam:  General:  Appears calm and comfortable and is in NAD, very frail/cachectic Eyes:  EOMI, normal lids, iris ENT:  grossly normal hearing, lips & tongue, mmm Neck:  no LAD, masses or thyromegaly Cardiovascular:  RRR, no m/r/g. No LE edema.  Respiratory:   CTA bilaterally with no wheezes/rales/rhonchi.  Normal respiratory effort.  Abdomen:  soft, NT, ND; + PEG tube; honeycomb dressing in epigastric region Skin:  no rash or induration seen on limited exam Musculoskeletal:  grossly normal tone BUE/BLE, no bony abnormality Psychiatric:  grossly normal mood and affect, speech fluent and appropriate, AOx3 Neurologic:  CN 2-12 grossly intact, moves all extremities in coordinated fashion   Data Reviewed: I have reviewed the patient's lab results since admission.  Pertinent labs for today include:  Na++  131 Hgb 12.3     Condition at discharge: fair  The results of significant diagnostics from this hospitalization (including imaging, microbiology, ancillary and laboratory) are listed below for reference.   Imaging Studies: DG Chest Port 1 View Result Date: 06/07/2023 CLINICAL DATA:  Port-A-Cath placement EXAM: PORTABLE CHEST 1 VIEW COMPARISON:  06/06/2023 FINDINGS: Single frontal view of the chest demonstrates right chest wall port via internal jugular approach, tip overlying the right atrium. The cardiac silhouette is unremarkable. No acute airspace disease, effusion, or pneumothorax. Mild background emphysema. There is free gas beneath the right hemidiaphragm, new since prior CT. No acute bony abnormalities. IMPRESSION: 1. New pneumoperitoneum beneath the right hemidiaphragm. According to the surgeon, a percutaneous gastrostomy tube was placed at the time of the chest wall port, accounting for the free gas. 2. Right chest wall port, tip overlying the right atrium. No evidence of pneumothorax. These results were called by telephone at the time of interpretation on 06/07/2023 at 4:17 pm to provider [facility] CINTRON-DIAZ , who verbally acknowledged these results. Electronically Signed   By: Sharlet Salina M.D.   On: 06/07/2023 16:23   DG C-Arm 1-60 Min-No Report Result Date: 06/07/2023 Fluoroscopy was utilized by the requesting physician.  No radiographic interpretation.   CT CHEST ABDOMEN PELVIS W CONTRAST Result Date: 06/06/2023 CLINICAL DATA:  Deep venous thrombosis. Concern for metastatic disease. 43 year old male. Esophageal mass on endoscopy. * Tracking Code: BO * EXAM: CT CHEST, ABDOMEN, AND PELVIS WITH CONTRAST TECHNIQUE: Multidetector CT imaging of the chest, abdomen and pelvis was performed following the standard protocol during bolus administration of intravenous contrast. RADIATION DOSE REDUCTION: This exam was performed according to the departmental dose-optimization program which includes  automated exposure control, adjustment of the mA and/or kV according to patient size and/or use of iterative reconstruction technique. CONTRAST:  80mL OMNIPAQUE IOHEXOL 300 MG/ML  SOLN COMPARISON:  None Available. FINDINGS: CT CHEST FINDINGS Cardiovascular: No significant vascular findings. Normal heart size. No pericardial effusion. No PE identified. Mediastinum/Nodes: Thickening through the distal esophagus leading up to the GE junction over approximately 4 cm. Lymph node adjacent to the distal esophagus measures 10 mm on image 45/2. Subcarinal lymph node is also suspicious measuring 12 mm. Lungs/Pleura: No suspicious pulmonary nodules. Normal pleural. Airways normal. Musculoskeletal: No aggressive osseous lesion. CT ABDOMEN AND PELVIS FINDINGS Hepatobiliary: Multiple round hypoenhancing lesions within LEFT RIGHT hepatic lobe. Conglomerate cluster of lesions in the posterior RIGHT hepatic lobe measures 6.75.6 cm on  image 58. A discrete lesion in the central LEFT hepatic lobe measures 17 mm on 55/2. There approximately 20 lesions in liver. Gallbladder normal. Pancreas: Pancreas is normal. No ductal dilatation. No pancreatic inflammation. Spleen: Normal spleen Adrenals/urinary tract: Adrenal glands and kidneys are normal. The ureters and bladder normal. Stomach/Bowel: Stomach, small bowel, appendix, and cecum are normal. The colon and rectosigmoid colon are normal. Vascular/Lymphatic: Abdominal aorta is normal caliber. Small nodule in the gastrohepatic ligament and celiac nodal stations are suspicious given the hepatic metastasis. Reproductive: Prostate unremarkable Other: No peritoneal metastasis Musculoskeletal: No aggressive osseous lesion. 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 IMPRESSION: CHEST: 1. 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 2. Suspicious lymph nodes adjacent to the distal esophagus and in the subcarinal nodal station. 3. No  pulmonary metastasis. PELVIS: 1. Multiple hypoenhancing lesions in the liver consistent with hepatic metastasis. 2. Probable metastatic adenopathy in the gastrohepatic ligament and celiac nodal stations. 3. No other evidence of metastatic disease in the abdomen pelvis. Electronically Signed   By: Genevive Bi M.D.   On: 06/06/2023 17:31   US Venous Img Lower Unilateral Left Result Date: 06/04/2023 CLINICAL DATA:  Several day history of left lower extremity pain, swelling, and warmth EXAM: LEFT LOWER EXTREMITY VENOUS DOPPLER ULTRASOUND TECHNIQUE: Gray-scale sonography with compression, as well as color and duplex ultrasound, were performed to evaluate the deep venous system(s) from the level of the common femoral vein through the popliteal and proximal calf veins. COMPARISON:  None Available. FINDINGS: VENOUS Echogenic thrombus is present within calf veins extending to the level of mid femoral vein. The calf veins, popliteal, and distal femoral veins are noncompressible. The proximal femoral vein is patent and compressible, however there is absence of normal respiratory plasticity and response to augmentation. Normal compressibility of the common femoral vein. Visualized portions of profunda femoral vein and great saphenous vein unremarkable. Limited views of the contralateral common femoral vein are unremarkable. OTHER None. Limitations: none IMPRESSION: Acute deep venous thrombosis of the left lower extremity extending from the calf veins to the level of mid femoral vein. These results will be called to the ordering clinician or representative by the Radiologist Assistant, and communication documented in the PACS or [employer]. Electronically Signed   By: Agustin Cree M.D.   On: 06/04/2023 11:52    Microbiology: No results found for this or any previous visit.  Labs: CBC: Recent Labs  Lab 06/04/23 1220 06/05/23 0327 06/06/23 0054 06/07/23 0414 06/08/23 0308 06/09/23 0550 06/10/23 0522   WBC 8.3   < > 8.9 8.8 9.4 7.6 7.9  NEUTROABS 5.8  --   --   --   --   --  4.9  HGB 13.2   < > 12.5* 12.6* 13.1 12.3* 12.3*  HCT 39.7   < > 36.5* 37.8* 38.7* 36.4* 36.9*  MCV 88.4   < > 86.1 87.3 85.1 87.1 87.0  PLT 241   < > 256 240 329 339 357   < > = values in this interval not displayed.   Basic Metabolic Panel: Recent Labs  Lab 06/04/23 1220 06/07/23 0414 06/08/23 0308 06/09/23 0550 06/10/23 0522  NA 135 137 136 136 131*  K 4.3 4.3 5.0 4.7 4.3  CL 98 100 99 102 96*  CO2 25 27 28 27 26   GLUCOSE 89 99 127* 88 93  BUN 13 15 15  25* 24*  CREATININE 0.77 0.90 0.71 0.86 0.81  CALCIUM 9.0 9.3 9.5 9.5  9.1  MG  --   --  2.2 2.2 2.1  PHOS  --   --  4.0 3.0 3.7   Liver Function Tests: Recent Labs  Lab 06/04/23 1220  AST 20  ALT 14  ALKPHOS 104  BILITOT 0.3  PROT 6.9  ALBUMIN 3.0*   CBG: No results for input(s): "GLUCAP" in the last 168 hours.  Discharge time spent: greater than 30 minutes.  Signed: Jonah Blue, MD [facility] 06/10/2023

## 2023-06-10 NOTE — Progress Notes (Signed)
 Hematology/Oncology Consult note [facility]  Telephone:([phone] Fax:([phone]) [phone]  Patient Care Team: [facility] as PCP - General   Name of the patient: Tyler Stanley  [id number]  01-29-1981   Date of visit: 06/09/23   Interval history-pain from PEG tube site insertion is better controlled at this time.  He is looking forward to going home and starting treatment for esophageal cancer soon.  ECOG PS- 1 Pain scale- 2 Opioid associated constipation- no  Review of systems- Review of Systems  Constitutional:  Positive for malaise/fatigue. Negative for chills, fever and weight loss.  HENT:  Negative for congestion, ear discharge and nosebleeds.   Eyes:  Negative for blurred vision.  Respiratory:  Negative for cough, hemoptysis, sputum production, shortness of breath and wheezing.   Cardiovascular:  Negative for chest pain, palpitations, orthopnea and claudication.  Gastrointestinal:  Negative for abdominal pain, blood in stool, constipation, diarrhea, heartburn, melena, nausea and vomiting.  Genitourinary:  Negative for dysuria, flank pain, frequency, hematuria and urgency.  Musculoskeletal:  Negative for back pain, joint pain and myalgias.  Skin:  Negative for rash.  Neurological:  Negative for dizziness, tingling, focal weakness, seizures, weakness and headaches.  Endo/Heme/Allergies:  Does not bruise/bleed easily.  Psychiatric/Behavioral:  Negative for depression and suicidal ideas. The patient does not have insomnia.       No Known Allergies   Past Medical History:  Diagnosis Date   ADHD      Past Surgical History:  Procedure Laterality Date   BIOPSY  06/06/2023   Procedure: BIOPSY;  Surgeon: Toney Reil, MD;  Location: [facility] ENDOSCOPY;  Service: Gastroenterology;;   ESOPHAGOGASTRODUODENOSCOPY (EGD) WITH PROPOFOL N/A 06/06/2023   Procedure: ESOPHAGOGASTRODUODENOSCOPY (EGD) WITH PROPOFOL;  Surgeon: Toney Reil, MD;   Location: [facility] ENDOSCOPY;  Service: Gastroenterology;  Laterality: N/A;   GASTROSTOMY N/A 06/07/2023   Procedure: INSERTION OF GASTROSTOMY TUBE;  Surgeon: Carolan Shiver, MD;  Location: [facility] ORS;  Service: General;  Laterality: N/A;   PORTACATH PLACEMENT Right 06/07/2023   Procedure: INSERTION PORT-A-CATH;  Surgeon: Carolan Shiver, MD;  Location: [facility] ORS;  Service: General;  Laterality: Right;    Social History   Socioeconomic History   Marital status: Single    Spouse name: Not on file   Number of children: Not on file   Years of education: Not on file   Highest education level: Not on file  Occupational History   Not on file  Tobacco Use   Smoking status: Every Day    Current packs/day: 0.50    Types: Cigarettes   Smokeless tobacco: Never  Vaping Use   Vaping status: Never Used  Substance and Sexual Activity   Alcohol use: Not Currently   Drug use: Not Currently    Types: Marijuana   Sexual activity: Yes  Other Topics Concern   Not on file  Social History Narrative   Not on file   Social Drivers of Health   Financial Resource Strain: Not on file  Food Insecurity: No Food Insecurity (06/04/2023)   Hunger Vital Sign    Worried About Running Out of Food in the Last Year: Never true    Ran Out of Food in the Last Year: Never true  Transportation Needs: No Transportation Needs (06/04/2023)   PRAPARE - Administrator, Civil Service (Medical): No    Lack of Transportation (Non-Medical): No  Physical Activity: Not on file  Stress: Not on file  Social Connections: Not on  file  Intimate Partner Violence: Not At Risk (06/04/2023)   Humiliation, Afraid, Rape, and Kick questionnaire    Fear of Current or Ex-Partner: No    Emotionally Abused: No    Physically Abused: No    Sexually Abused: No    History reviewed. No pertinent family history.   Current Facility-Administered Medications:    acetaminophen (TYLENOL) tablet 650 mg, 650 mg, Oral, Q6H  PRN **OR** acetaminophen (TYLENOL) suppository 650 mg, 650 mg, Rectal, Q6H PRN, Barrie Folk, RPH   apixaban Everlene Balls) tablet 5 mg, 5 mg, Oral, BID, Barrie Folk, RPH, 5 mg at 06/10/23 0856   docusate (COLACE) 50 MG/5ML liquid 100 mg, 100 mg, Oral, BID, Barrie Folk, RPH, 100 mg at 06/10/23 1610   feeding supplement (ENSURE ENLIVE / ENSURE PLUS) liquid 237 mL, 237 mL, Oral, BID BM, Carolan Shiver, MD, 237 mL at 06/10/23 0912   feeding supplement (OSMOLITE 1.5 CAL) liquid 237 mL, 237 mL, Per Tube, 6 X Daily, Sreeram, Narendranath, MD, 237 mL at 06/10/23 0859   free water 100 mL, 100 mL, Per Tube, 6 X Daily, Sreeram, Narendranath, MD, 100 mL at 06/10/23 0913   methocarbamol (ROBAXIN) tablet 750 mg, 750 mg, Oral, TID, Morton Stall, NP, 750 mg at 06/10/23 9604   nicotine (NICODERM CQ - dosed in mg/24 hours) patch 14 mg, 14 mg, Transdermal, Daily, Cintron-Diaz, Lurline Idol, MD, 14 mg at 06/10/23 5409   nicotine polacrilex (NICORETTE) gum 2 mg, 2 mg, Oral, PRN, Carolan Shiver, MD, 2 mg at 06/05/23 2120   ondansetron (ZOFRAN) injection 4 mg, 4 mg, Intravenous, Q6H PRN, Carolan Shiver, MD, 4 mg at 06/07/23 2136   oxyCODONE (Oxy IR/ROXICODONE) immediate release tablet 5 mg, 5 mg, Oral, Q4H PRN, Barrie Folk, RPH, 5 mg at 06/10/23 8119   pantoprazole (PROTONIX) injection 40 mg, 40 mg, Intravenous, Q12H, Cintron-Diaz, Lurline Idol, MD, 40 mg at 06/10/23 0858   polyethylene glycol (MIRALAX / GLYCOLAX) packet 17 g, 17 g, Oral, Daily, Barrie Folk, RPH, 17 g at 06/10/23 0857   pregabalin (LYRICA) capsule 100 mg, 100 mg, Oral, TID, Barrie Folk, RPH, 100 mg at 06/10/23 0856   sennosides (SENOKOT) 8.8 MG/5ML syrup 5 mL, 5 mL, Oral, QHS, Barrie Folk, RPH, 5 mL at 06/09/23 2210   thiamine (VITAMIN B1) tablet 100 mg, 100 mg, Oral, Daily, Barrie Folk, RPH, 100 mg at 06/10/23 0857  Physical exam:  Vitals:   06/09/23 1640 06/09/23 2353 06/10/23  0500 06/10/23 0747  BP: 118/84 124/87  117/76  Pulse: 98 (!) 109  99  Resp: 17 18  16   Temp: 98.2 F (36.8 C) 98.2 F (36.8 C)  98.7 F (37.1 C)  TempSrc: Oral   Oral  SpO2: 99% 98%  96%  Weight:   150 lb 5.7 oz (68.2 kg)   Height:       Physical Exam Cardiovascular:     Rate and Rhythm: Normal rate and regular rhythm.     Heart sounds: Normal heart sounds.  Pulmonary:     Effort: Pulmonary effort is normal.     Breath sounds: Normal breath sounds.  Abdominal:     General: Bowel sounds are normal.     Palpations: Abdomen is soft.     Comments: PEG tube in place  Skin:    General: Skin is warm and dry.  Neurological:     Mental Status: He is alert and oriented to person, place, and time.  Latest Ref Rng & Units 06/10/2023    5:22 AM  CMP  Glucose 70 - 99 mg/dL 93   BUN 6 - 20 mg/dL 24   Creatinine 1.61 - 1.24 mg/dL 0.96   Sodium 045 - 409 mmol/L 131   Potassium 3.5 - 5.1 mmol/L 4.3   Chloride 98 - 111 mmol/L 96   CO2 22 - 32 mmol/L 26   Calcium 8.9 - 10.3 mg/dL 9.1       Latest Ref Rng & Units 06/10/2023    5:22 AM  CBC  WBC 4.0 - 10.5 K/uL 7.9   Hemoglobin 13.0 - 17.0 g/dL 81.1   Hematocrit 91.4 - 52.0 % 36.9   Platelets 150 - 400 K/uL 357     @IMAGES @  DG Chest Port 1 View Result Date: 06/07/2023 CLINICAL DATA:  Port-A-Cath placement EXAM: PORTABLE CHEST 1 VIEW COMPARISON:  06/06/2023 FINDINGS: Single frontal view of the chest demonstrates right chest wall port via internal jugular approach, tip overlying the right atrium. The cardiac silhouette is unremarkable. No acute airspace disease, effusion, or pneumothorax. Mild background emphysema. There is free gas beneath the right hemidiaphragm, new since prior CT. No acute bony abnormalities. IMPRESSION: 1. New pneumoperitoneum beneath the right hemidiaphragm. According to the surgeon, a percutaneous gastrostomy tube was placed at the time of the chest wall port, accounting for the free gas. 2. Right chest  wall port, tip overlying the right atrium. No evidence of pneumothorax. These results were called by telephone at the time of interpretation on 06/07/2023 at 4:17 pm to provider [facility] CINTRON-DIAZ , who verbally acknowledged these results. Electronically Signed   By: Sharlet Salina M.D.   On: 06/07/2023 16:23   DG C-Arm 1-60 Min-No Report Result Date: 06/07/2023 Fluoroscopy was utilized by the requesting physician.  No radiographic interpretation.   CT CHEST ABDOMEN PELVIS W CONTRAST Result Date: 06/06/2023 CLINICAL DATA:  Deep venous thrombosis. Concern for metastatic disease. 43 year old male. Esophageal mass on endoscopy. * Tracking Code: BO * EXAM: CT CHEST, ABDOMEN, AND PELVIS WITH CONTRAST TECHNIQUE: Multidetector CT imaging of the chest, abdomen and pelvis was performed following the standard protocol during bolus administration of intravenous contrast. RADIATION DOSE REDUCTION: This exam was performed according to the departmental dose-optimization program which includes automated exposure control, adjustment of the mA and/or kV according to patient size and/or use of iterative reconstruction technique. CONTRAST:  80mL OMNIPAQUE IOHEXOL 300 MG/ML  SOLN COMPARISON:  None Available. FINDINGS: CT CHEST FINDINGS Cardiovascular: No significant vascular findings. Normal heart size. No pericardial effusion. No PE identified. Mediastinum/Nodes: Thickening through the distal esophagus leading up to the GE junction over approximately 4 cm. Lymph node adjacent to the distal esophagus measures 10 mm on image 45/2. Subcarinal lymph node is also suspicious measuring 12 mm. Lungs/Pleura: No suspicious pulmonary nodules. Normal pleural. Airways normal. Musculoskeletal: No aggressive osseous lesion. CT ABDOMEN AND PELVIS FINDINGS Hepatobiliary: Multiple round hypoenhancing lesions within LEFT RIGHT hepatic lobe. Conglomerate cluster of lesions in the posterior RIGHT hepatic lobe measures 6.75.6 cm on image 58. A  discrete lesion in the central LEFT hepatic lobe measures 17 mm on 55/2. There approximately 20 lesions in liver. Gallbladder normal. Pancreas: Pancreas is normal. No ductal dilatation. No pancreatic inflammation. Spleen: Normal spleen Adrenals/urinary tract: Adrenal glands and kidneys are normal. The ureters and bladder normal. Stomach/Bowel: Stomach, small bowel, appendix, and cecum are normal. The colon and rectosigmoid colon are normal. Vascular/Lymphatic: Abdominal aorta is normal caliber. Small nodule in the gastrohepatic ligament and  celiac nodal stations are suspicious given the hepatic metastasis. Reproductive: Prostate unremarkable Other: No peritoneal metastasis Musculoskeletal: No aggressive osseous lesion. 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 IMPRESSION: CHEST: 1. 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 2. Suspicious lymph nodes adjacent to the distal esophagus and in the subcarinal nodal station. 3. No pulmonary metastasis. PELVIS: 1. Multiple hypoenhancing lesions in the liver consistent with hepatic metastasis. 2. Probable metastatic adenopathy in the gastrohepatic ligament and celiac nodal stations. 3. No other evidence of metastatic disease in the abdomen pelvis. Electronically Signed   By: Genevive Bi M.D.   On: 06/06/2023 17:31   US Venous Img Lower Unilateral Left Result Date: 06/04/2023 CLINICAL DATA:  Several day history of left lower extremity pain, swelling, and warmth EXAM: LEFT LOWER EXTREMITY VENOUS DOPPLER ULTRASOUND TECHNIQUE: Gray-scale sonography with compression, as well as color and duplex ultrasound, were performed to evaluate the deep venous system(s) from the level of the common femoral vein through the popliteal and proximal calf veins. COMPARISON:  None Available. FINDINGS: VENOUS Echogenic thrombus is present within calf veins extending to the level of mid femoral vein. The calf veins, popliteal, and  distal femoral veins are noncompressible. The proximal femoral vein is patent and compressible, however there is absence of normal respiratory plasticity and response to augmentation. Normal compressibility of the common femoral vein. Visualized portions of profunda femoral vein and great saphenous vein unremarkable. Limited views of the contralateral common femoral vein are unremarkable. OTHER None. Limitations: none IMPRESSION: Acute deep venous thrombosis of the left lower extremity extending from the calf veins to the level of mid femoral vein. These results will be called to the ordering clinician or representative by the Radiologist Assistant, and communication documented in the PACS or [employer]. Electronically Signed   By: Agustin Cree M.D.   On: 06/04/2023 11:52     Assessment and plan- Patient is a 43 y.o. male with history of metastatic esophageal cancer and liver metastases admitted for dysphagia  Patient found to have near obstructing gastroesophageal mass which was biopsy-proven adenocarcinoma.  He therefore underwent palliative PEG tube insertion.  At the time of PEG tube placement patient also had liver biopsy which was consistent with liver metastases from esophageal adenocarcinoma.  Patient will be starting palliative radiation to help him with his dysphagia next week as an outpatient.  He has already had a port placement and we are planning to start chemotherapy with FOLFOX next week.  Discussed risks and benefits of FOLFOX including all but not limited to nausea vomiting low blood counts risk of infections and hospitalization.  Risk of peripheral neuropathy associated with oxaliplatin.  This will be given IV every 2 weeks until progression or toxicity.  Patient will need to carry chemo pump for 2 days and come back after that for pump disconnect.  Treatment will be given with a palliative intent.  We are awaiting additional tests including HER2, PD-L1/CPS as well as Claudin 14  testing to see if he would qualify for additional agents in addition to FOLFOX.  Palliative care will also follow him along as an outpatient.  Patient and his sister comprehend my plan well   Visit Diagnosis: Metastatic esophageal cancer   Dr. Owens Shark, MD, MPH [facility] [phone] 06/10/2023 9:22 AM

## 2023-06-11 ENCOUNTER — Telehealth: Payer: Self-pay

## 2023-06-11 ENCOUNTER — Other Ambulatory Visit: Payer: Self-pay

## 2023-06-11 NOTE — Progress Notes (Signed)
 Nutrition  Enteral nutrition not set up at discharge from hospital. Inpatient RD has contacted [employer] and working on getting patient set up at this time. Patient does not have any osmolite 1.5 left. 2 cases left at front desk along with bag of supplies for pick up.    RD will see on 3/5 during infusion  Emary Zalar B. Freida Busman, RD, LDN Registered Dietitian [phone]

## 2023-06-14 ENCOUNTER — Telehealth: Payer: Self-pay | Admitting: Oncology

## 2023-06-14 ENCOUNTER — Other Ambulatory Visit: Payer: Self-pay

## 2023-06-14 ENCOUNTER — Inpatient Hospital Stay: Payer: 59

## 2023-06-14 ENCOUNTER — Telehealth: Payer: Self-pay

## 2023-06-14 ENCOUNTER — Ambulatory Visit
Admission: RE | Admit: 2023-06-14 | Discharge: 2023-06-14 | Disposition: A | Discharge status: Home / Self Care | Payer: 59 | Source: Ambulatory Visit | Attending: Radiation Oncology | Admitting: Radiation Oncology

## 2023-06-14 DIAGNOSIS — Z86718 Personal history of other venous thrombosis and embolism: Secondary | ICD-10-CM | POA: Diagnosis not present

## 2023-06-14 DIAGNOSIS — Z5986 Financial insecurity: Secondary | ICD-10-CM | POA: Insufficient documentation

## 2023-06-14 DIAGNOSIS — Z931 Gastrostomy status: Secondary | ICD-10-CM | POA: Diagnosis not present

## 2023-06-14 DIAGNOSIS — R109 Unspecified abdominal pain: Secondary | ICD-10-CM | POA: Diagnosis not present

## 2023-06-14 DIAGNOSIS — J439 Emphysema, unspecified: Secondary | ICD-10-CM | POA: Insufficient documentation

## 2023-06-14 DIAGNOSIS — I82402 Acute embolism and thrombosis of unspecified deep veins of left lower extremity: Secondary | ICD-10-CM | POA: Diagnosis not present

## 2023-06-14 DIAGNOSIS — C159 Malignant neoplasm of esophagus, unspecified: Secondary | ICD-10-CM | POA: Diagnosis not present

## 2023-06-14 DIAGNOSIS — C155 Malignant neoplasm of lower third of esophagus: Secondary | ICD-10-CM | POA: Insufficient documentation

## 2023-06-14 DIAGNOSIS — R5383 Other fatigue: Secondary | ICD-10-CM | POA: Insufficient documentation

## 2023-06-14 DIAGNOSIS — F17210 Nicotine dependence, cigarettes, uncomplicated: Secondary | ICD-10-CM | POA: Diagnosis not present

## 2023-06-14 DIAGNOSIS — Z79899 Other long term (current) drug therapy: Secondary | ICD-10-CM | POA: Insufficient documentation

## 2023-06-14 DIAGNOSIS — Z7901 Long term (current) use of anticoagulants: Secondary | ICD-10-CM | POA: Diagnosis not present

## 2023-06-14 DIAGNOSIS — Z510 Encounter for antineoplastic radiation therapy: Secondary | ICD-10-CM | POA: Insufficient documentation

## 2023-06-14 DIAGNOSIS — K222 Esophageal obstruction: Secondary | ICD-10-CM | POA: Diagnosis not present

## 2023-06-14 DIAGNOSIS — Z515 Encounter for palliative care: Secondary | ICD-10-CM | POA: Insufficient documentation

## 2023-06-14 DIAGNOSIS — R1310 Dysphagia, unspecified: Secondary | ICD-10-CM | POA: Diagnosis not present

## 2023-06-14 DIAGNOSIS — C787 Secondary malignant neoplasm of liver and intrahepatic bile duct: Secondary | ICD-10-CM | POA: Insufficient documentation

## 2023-06-14 LAB — RAD ONC ARIA SESSION SUMMARY
Course Elapsed Days: 0
Plan Fractions Treated to Date: 1
Plan Prescribed Dose Per Fraction: 2 Gy
Plan Total Fractions Prescribed: 15
Plan Total Prescribed Dose: 30 Gy
Reference Point Dosage Given to Date: 2 Gy
Reference Point Session Dosage Given: 2 Gy
Session Number: 1

## 2023-06-14 NOTE — Telephone Encounter (Signed)
What would you like me to do?

## 2023-06-14 NOTE — Telephone Encounter (Signed)
 Jahkari called and left message through our answering service that he needed to cancel today's chemo education and reschedule for another day.

## 2023-06-15 ENCOUNTER — Other Ambulatory Visit: Payer: Self-pay | Admitting: Oncology

## 2023-06-15 ENCOUNTER — Encounter: Payer: Self-pay | Admitting: Oncology

## 2023-06-15 ENCOUNTER — Telehealth: Payer: Self-pay

## 2023-06-15 ENCOUNTER — Ambulatory Visit
Admission: RE | Admit: 2023-06-15 | Discharge: 2023-06-15 | Disposition: A | Discharge status: Home / Self Care | Payer: 59 | Source: Ambulatory Visit | Attending: Radiation Oncology | Admitting: Radiation Oncology

## 2023-06-15 ENCOUNTER — Other Ambulatory Visit: Payer: Self-pay

## 2023-06-15 ENCOUNTER — Inpatient Hospital Stay

## 2023-06-15 ENCOUNTER — Inpatient Hospital Stay: Payer: 59 | Admitting: Oncology

## 2023-06-15 DIAGNOSIS — Z7901 Long term (current) use of anticoagulants: Secondary | ICD-10-CM | POA: Insufficient documentation

## 2023-06-15 DIAGNOSIS — F17210 Nicotine dependence, cigarettes, uncomplicated: Secondary | ICD-10-CM | POA: Insufficient documentation

## 2023-06-15 DIAGNOSIS — Z931 Gastrostomy status: Secondary | ICD-10-CM | POA: Insufficient documentation

## 2023-06-15 DIAGNOSIS — Z79899 Other long term (current) drug therapy: Secondary | ICD-10-CM | POA: Insufficient documentation

## 2023-06-15 DIAGNOSIS — C159 Malignant neoplasm of esophagus, unspecified: Secondary | ICD-10-CM | POA: Insufficient documentation

## 2023-06-15 DIAGNOSIS — R109 Unspecified abdominal pain: Secondary | ICD-10-CM | POA: Insufficient documentation

## 2023-06-15 DIAGNOSIS — R5383 Other fatigue: Secondary | ICD-10-CM | POA: Insufficient documentation

## 2023-06-15 DIAGNOSIS — Z515 Encounter for palliative care: Secondary | ICD-10-CM | POA: Insufficient documentation

## 2023-06-15 DIAGNOSIS — J439 Emphysema, unspecified: Secondary | ICD-10-CM | POA: Insufficient documentation

## 2023-06-15 DIAGNOSIS — C787 Secondary malignant neoplasm of liver and intrahepatic bile duct: Secondary | ICD-10-CM | POA: Insufficient documentation

## 2023-06-15 DIAGNOSIS — I82402 Acute embolism and thrombosis of unspecified deep veins of left lower extremity: Secondary | ICD-10-CM | POA: Insufficient documentation

## 2023-06-15 DIAGNOSIS — Z5986 Financial insecurity: Secondary | ICD-10-CM | POA: Insufficient documentation

## 2023-06-15 DIAGNOSIS — Z510 Encounter for antineoplastic radiation therapy: Secondary | ICD-10-CM | POA: Diagnosis not present

## 2023-06-15 DIAGNOSIS — Z86718 Personal history of other venous thrombosis and embolism: Secondary | ICD-10-CM | POA: Insufficient documentation

## 2023-06-15 LAB — RAD ONC ARIA SESSION SUMMARY
Course Elapsed Days: 1
Plan Fractions Treated to Date: 2
Plan Prescribed Dose Per Fraction: 2 Gy
Plan Total Fractions Prescribed: 15
Plan Total Prescribed Dose: 30 Gy
Reference Point Dosage Given to Date: 4 Gy
Reference Point Session Dosage Given: 2 Gy
Session Number: 2

## 2023-06-15 NOTE — Telephone Encounter (Signed)
 Spoke to Tyler Stanley regarding sperm cryopreservation interest. Provided further education on process and out of pocket cost if insurance does not cover. He is interested and referral has been sent to [facility]. He is aware that sperm banking takes place at the [place] or [facility] location. He was asked to call us if he does not get an appointment within 1 week.

## 2023-06-16 ENCOUNTER — Telehealth: Payer: Self-pay

## 2023-06-16 ENCOUNTER — Encounter: Payer: Self-pay | Admitting: Oncology

## 2023-06-16 ENCOUNTER — Other Ambulatory Visit: Payer: Self-pay

## 2023-06-16 ENCOUNTER — Inpatient Hospital Stay: Payer: 59 | Admitting: Oncology

## 2023-06-16 ENCOUNTER — Inpatient Hospital Stay: Payer: 59

## 2023-06-16 ENCOUNTER — Ambulatory Visit
Admission: RE | Admit: 2023-06-16 | Discharge: 2023-06-16 | Disposition: A | Discharge status: Home / Self Care | Payer: 59 | Source: Ambulatory Visit | Attending: Radiation Oncology | Admitting: Radiation Oncology

## 2023-06-16 DIAGNOSIS — Z510 Encounter for antineoplastic radiation therapy: Secondary | ICD-10-CM | POA: Diagnosis not present

## 2023-06-16 DIAGNOSIS — I82402 Acute embolism and thrombosis of unspecified deep veins of left lower extremity: Secondary | ICD-10-CM

## 2023-06-16 LAB — RAD ONC ARIA SESSION SUMMARY
Course Elapsed Days: 2
Plan Fractions Treated to Date: 3
Plan Prescribed Dose Per Fraction: 2 Gy
Plan Total Fractions Prescribed: 15
Plan Total Prescribed Dose: 30 Gy
Reference Point Dosage Given to Date: 6 Gy
Reference Point Session Dosage Given: 2 Gy
Session Number: 3

## 2023-06-17 ENCOUNTER — Telehealth: Payer: Self-pay

## 2023-06-17 ENCOUNTER — Other Ambulatory Visit: Payer: Self-pay

## 2023-06-17 ENCOUNTER — Ambulatory Visit
Admission: RE | Admit: 2023-06-17 | Discharge: 2023-06-17 | Disposition: A | Discharge status: Home / Self Care | Payer: 59 | Source: Ambulatory Visit | Attending: Radiation Oncology | Admitting: Radiation Oncology

## 2023-06-17 DIAGNOSIS — Z510 Encounter for antineoplastic radiation therapy: Secondary | ICD-10-CM | POA: Diagnosis not present

## 2023-06-17 LAB — RAD ONC ARIA SESSION SUMMARY
Course Elapsed Days: 3
Plan Fractions Treated to Date: 4
Plan Prescribed Dose Per Fraction: 2 Gy
Plan Total Fractions Prescribed: 15
Plan Total Prescribed Dose: 30 Gy
Reference Point Dosage Given to Date: 8 Gy
Reference Point Session Dosage Given: 2 Gy
Session Number: 4

## 2023-06-17 NOTE — Telephone Encounter (Signed)
 Mr. Tyler Stanley not able to afford [facility]. More affordable option offered to him at [facility] fertility and they can typically see within 1-2 days. Prices provided and he would like referral. Referral sent.

## 2023-06-17 NOTE — Progress Notes (Signed)
..   Medicaid Managed Care   Unsuccessful Outreach Note  06/17/2023 Name: Tyler Stanley MRN: [id number] DOB: 25-Feb-1981  Referred by: [facility] Reason for referral : High Risk Managed Medicaid (Initial call made to the patient today. I left a message on his VM.)   An unsuccessful telephone outreach was attempted today. The patient was referred to the case management team for assistance with care management and care coordination.   Follow Up Plan: A HIPAA compliant phone message was left for the patient providing contact information and requesting a return call.  The care management team will reach out to the patient again over the next 7 days.   Weston Settle [facility], [facility] Guide Direct Dial: [phone]  Fax: [phone]

## 2023-06-18 ENCOUNTER — Inpatient Hospital Stay

## 2023-06-18 ENCOUNTER — Ambulatory Visit: Payer: 59

## 2023-06-18 ENCOUNTER — Ambulatory Visit

## 2023-06-18 ENCOUNTER — Telehealth: Payer: Self-pay

## 2023-06-18 NOTE — Progress Notes (Signed)
 Nutrition  RD was scheduled to see patient on 3/2 during infusion but infusion postponed.  Nutrition visit rescheduled for today after radiation.  Received message from radiation RN saying that patient called and was not going to be able to come to radiation today.    RD called to speak with patient.  No answer.  No option to leave voicemail as mailbox is full.     Javonne Dorko B. Freida Busman, RD, LDN Registered Dietitian [phone]

## 2023-06-21 ENCOUNTER — Telehealth: Payer: Self-pay | Admitting: Nurse Practitioner

## 2023-06-21 ENCOUNTER — Telehealth: Payer: Self-pay

## 2023-06-21 ENCOUNTER — Encounter: Payer: Self-pay | Admitting: *Deleted

## 2023-06-21 ENCOUNTER — Ambulatory Visit
Admission: RE | Admit: 2023-06-21 | Discharge: 2023-06-21 | Disposition: A | Discharge status: Home / Self Care | Payer: 59 | Source: Ambulatory Visit | Attending: Radiation Oncology | Admitting: Radiation Oncology

## 2023-06-21 ENCOUNTER — Other Ambulatory Visit: Payer: Self-pay

## 2023-06-21 ENCOUNTER — Other Ambulatory Visit: Payer: Self-pay | Admitting: Nurse Practitioner

## 2023-06-21 DIAGNOSIS — M79601 Pain in right arm: Secondary | ICD-10-CM

## 2023-06-21 DIAGNOSIS — Z510 Encounter for antineoplastic radiation therapy: Secondary | ICD-10-CM | POA: Diagnosis not present

## 2023-06-21 LAB — RAD ONC ARIA SESSION SUMMARY
Course Elapsed Days: 7
Plan Fractions Treated to Date: 5
Plan Prescribed Dose Per Fraction: 2 Gy
Plan Total Fractions Prescribed: 15
Plan Total Prescribed Dose: 30 Gy
Reference Point Dosage Given to Date: 10 Gy
Reference Point Session Dosage Given: 2 Gy
Session Number: 5

## 2023-06-21 NOTE — Telephone Encounter (Signed)
 Having new right arm pain located in the inner bend of arm and unable to fully extend arm.  He is concerned because this pain is similar when diagnosed with LE DVT in 05/2023.  Is taking Eliquis.  Also asking if CT can be obtained to assess the gastroesophageal mass after XRT for 1 week.  Advised it's not standard to obtain images after 1 week of treatment but he said that Dr. Abdulrahim Siddiqi Robert did mention getting an updated CT.

## 2023-06-21 NOTE — Telephone Encounter (Signed)
 Lauren, I believe patient was returning your call.  He called triage line and just left his name and number.

## 2023-06-21 NOTE — Telephone Encounter (Signed)
 Tyler Stanley tried contacting patient with no answer

## 2023-06-21 NOTE — Telephone Encounter (Signed)
 Tried returning patient's call. No answer. Left vm. He can be scheduled for phone visit or in person [facility] to address his concerns.

## 2023-06-21 NOTE — Telephone Encounter (Signed)
 Patient scheduled for stat US for tomorrow 06/22/2023 at 9:30 am, patient to arrive at 8:55 am. There was no availability for today.

## 2023-06-22 ENCOUNTER — Encounter: Payer: Self-pay | Admitting: Oncology

## 2023-06-22 ENCOUNTER — Ambulatory Visit
Admission: RE | Admit: 2023-06-22 | Discharge: 2023-06-22 | Disposition: A | Discharge status: Home / Self Care | Payer: 59 | Source: Ambulatory Visit | Attending: Radiation Oncology | Admitting: Radiation Oncology

## 2023-06-22 ENCOUNTER — Ambulatory Visit
Admission: RE | Admit: 2023-06-22 | Discharge: 2023-06-22 | Disposition: A | Discharge status: Home / Self Care | Source: Ambulatory Visit | Attending: Nurse Practitioner | Admitting: Nurse Practitioner

## 2023-06-22 ENCOUNTER — Telehealth: Payer: Self-pay | Admitting: Nurse Practitioner

## 2023-06-22 ENCOUNTER — Other Ambulatory Visit: Payer: Self-pay

## 2023-06-22 DIAGNOSIS — Z510 Encounter for antineoplastic radiation therapy: Secondary | ICD-10-CM | POA: Diagnosis not present

## 2023-06-22 DIAGNOSIS — M79601 Pain in right arm: Secondary | ICD-10-CM

## 2023-06-22 LAB — RAD ONC ARIA SESSION SUMMARY
Course Elapsed Days: 8
Plan Fractions Treated to Date: 6
Plan Prescribed Dose Per Fraction: 2 Gy
Plan Total Fractions Prescribed: 15
Plan Total Prescribed Dose: 30 Gy
Reference Point Dosage Given to Date: 12 Gy
Reference Point Session Dosage Given: 2 Gy
Session Number: 6

## 2023-06-22 NOTE — Telephone Encounter (Signed)
 Called patient to review ultrasound results and plan. No answer. Unable to leave vm d/t vm being full.

## 2023-06-23 ENCOUNTER — Ambulatory Visit: Admission: RE | Admit: 2023-06-23 | Payer: 59 | Source: Ambulatory Visit

## 2023-06-23 ENCOUNTER — Ambulatory Visit

## 2023-06-24 ENCOUNTER — Other Ambulatory Visit: Payer: Self-pay | Admitting: *Deleted

## 2023-06-24 ENCOUNTER — Ambulatory Visit: Payer: 59

## 2023-06-24 ENCOUNTER — Encounter: Payer: Self-pay | Admitting: Nurse Practitioner

## 2023-06-24 ENCOUNTER — Ambulatory Visit

## 2023-06-24 ENCOUNTER — Telehealth: Payer: Self-pay

## 2023-06-24 ENCOUNTER — Ambulatory Visit
Admission: RE | Admit: 2023-06-24 | Discharge: 2023-06-24 | Disposition: A | Discharge status: Home / Self Care | Source: Ambulatory Visit | Attending: Radiation Oncology | Admitting: Radiation Oncology

## 2023-06-24 DIAGNOSIS — Z510 Encounter for antineoplastic radiation therapy: Secondary | ICD-10-CM | POA: Diagnosis not present

## 2023-06-24 NOTE — Patient Instructions (Signed)
 Visit Information  Mr. Tyler Stanley  - as a part of your Medicaid benefit, you are eligible for care management and care coordination services at no cost or copay. I was unable to reach you by phone today but would be happy to help you with your health related needs. Please feel free to call me @ [phone]  A member of the Managed Medicaid care management team will reach out to you again over the next 7 days.   Estanislado Emms RN, BSN [place]  [facility] RN Care Manager [phone]

## 2023-06-24 NOTE — Patient Outreach (Signed)
  Medicaid Managed Care   Unsuccessful Attempt Note   06/24/2023 Name: Tyler Stanley MRN: [id number] DOB: 1980/11/25  Referred by: [facility] Reason for referral : High Risk Managed Medicaid (Unsuccessful RNCM initial telephone outreach)   An unsuccessful telephone outreach was attempted today. The patient was referred to the case management team for assistance with care management and care coordination.    Follow Up Plan: A HIPAA compliant phone message was left for the patient providing contact information and requesting a return call. and The Managed Medicaid care management team will reach out to the patient again over the next 7 days.    Estanislado Emms RN, BSN [place]  [facility] RN Care Manager [phone]

## 2023-06-25 ENCOUNTER — Ambulatory Visit: Payer: 59

## 2023-06-25 ENCOUNTER — Inpatient Hospital Stay

## 2023-06-25 ENCOUNTER — Encounter: Payer: Self-pay | Admitting: Oncology

## 2023-06-25 ENCOUNTER — Ambulatory Visit

## 2023-06-25 NOTE — Progress Notes (Signed)
 Nutrition  Radiation treatment cancelled for today.  Patient did not show up for nutrition visit.   Will reschedule nutrition visit to 3/19 during chemo infusion.   Danni Shima B. Elease Hashimoto, CSO, LDN Registered Dietitian [phone]

## 2023-06-26 DIAGNOSIS — Z510 Encounter for antineoplastic radiation therapy: Secondary | ICD-10-CM | POA: Diagnosis not present

## 2023-06-28 ENCOUNTER — Ambulatory Visit
Admission: RE | Admit: 2023-06-28 | Discharge: 2023-06-28 | Disposition: A | Discharge status: Home / Self Care | Payer: 59 | Source: Ambulatory Visit | Attending: Radiation Oncology | Admitting: Radiation Oncology

## 2023-06-28 ENCOUNTER — Other Ambulatory Visit: Payer: Self-pay

## 2023-06-28 DIAGNOSIS — Z510 Encounter for antineoplastic radiation therapy: Secondary | ICD-10-CM | POA: Diagnosis not present

## 2023-06-28 LAB — RAD ONC ARIA SESSION SUMMARY
Course Elapsed Days: 14
Plan Fractions Treated to Date: 1
Plan Prescribed Dose Per Fraction: 2 Gy
Plan Total Fractions Prescribed: 9
Plan Total Prescribed Dose: 18 Gy
Reference Point Dosage Given to Date: 14 Gy
Reference Point Session Dosage Given: 2 Gy
Session Number: 7

## 2023-06-29 ENCOUNTER — Other Ambulatory Visit: Payer: Self-pay

## 2023-06-29 ENCOUNTER — Encounter: Payer: Self-pay | Admitting: Oncology

## 2023-06-29 ENCOUNTER — Ambulatory Visit
Admission: RE | Admit: 2023-06-29 | Discharge: 2023-06-29 | Disposition: A | Discharge status: Home / Self Care | Payer: 59 | Source: Ambulatory Visit | Attending: Radiation Oncology | Admitting: Radiation Oncology

## 2023-06-29 DIAGNOSIS — Z510 Encounter for antineoplastic radiation therapy: Secondary | ICD-10-CM | POA: Diagnosis not present

## 2023-06-29 LAB — RAD ONC ARIA SESSION SUMMARY
Course Elapsed Days: 15
Plan Fractions Treated to Date: 2
Plan Prescribed Dose Per Fraction: 2 Gy
Plan Total Fractions Prescribed: 9
Plan Total Prescribed Dose: 18 Gy
Reference Point Dosage Given to Date: 16 Gy
Reference Point Session Dosage Given: 2 Gy
Session Number: 8

## 2023-06-30 ENCOUNTER — Inpatient Hospital Stay

## 2023-06-30 ENCOUNTER — Ambulatory Visit: Payer: 59

## 2023-06-30 ENCOUNTER — Ambulatory Visit

## 2023-06-30 ENCOUNTER — Inpatient Hospital Stay: Admitting: Oncology

## 2023-06-30 NOTE — Progress Notes (Signed)
 Nutrition  Received message from radiation RN that patient called and will not be in for appointments today.   Tyler Stanley B. Elease Hashimoto, CSO, LDN Registered Dietitian [phone]

## 2023-07-01 ENCOUNTER — Other Ambulatory Visit: Payer: Self-pay

## 2023-07-01 ENCOUNTER — Ambulatory Visit
Admission: RE | Admit: 2023-07-01 | Discharge: 2023-07-01 | Disposition: A | Discharge status: Home / Self Care | Payer: 59 | Source: Ambulatory Visit | Attending: Radiation Oncology | Admitting: Radiation Oncology

## 2023-07-01 ENCOUNTER — Telehealth: Payer: Self-pay

## 2023-07-01 DIAGNOSIS — Z510 Encounter for antineoplastic radiation therapy: Secondary | ICD-10-CM | POA: Diagnosis not present

## 2023-07-01 LAB — RAD ONC ARIA SESSION SUMMARY
Course Elapsed Days: 17
Plan Fractions Treated to Date: 3
Plan Prescribed Dose Per Fraction: 2 Gy
Plan Total Fractions Prescribed: 9
Plan Total Prescribed Dose: 18 Gy
Reference Point Dosage Given to Date: 18 Gy
Reference Point Session Dosage Given: 2 Gy
Session Number: 9

## 2023-07-01 NOTE — Telephone Encounter (Signed)
 error

## 2023-07-01 NOTE — Progress Notes (Signed)
 Met with Tyler Stanley after his radiation appointment regarding his 2 missed chemotherapy appointments and concerns. He voiced his fear with starting chemotherapy. He does need follow up with his medical oncologist since these have been missed with his chemotherapy appointments. He reports a lot of vomiting yesterday and admits to overeating which included crackers and whitefish. He has not been able to meet with the dietician as these appointments have also been missed. He does agree that he needs the recommended follow  and I have arranged for him to see Dr. Smith Robert and palliative care for his symptom management tomorrow after radiation. He is aware and agrees to come to these following radiation. His mother has also requested for him to get a second opinion at [facility] as she has heard he can "get his tumor removed" for stage IV cancer. Once again I recommend he keep his appointments with medical oncology to receive further education regarding his care. His appointments at [facility] are scheduled for 07/09/23.

## 2023-07-02 ENCOUNTER — Inpatient Hospital Stay

## 2023-07-02 ENCOUNTER — Other Ambulatory Visit: Payer: Self-pay

## 2023-07-02 ENCOUNTER — Inpatient Hospital Stay (HOSPITAL_BASED_OUTPATIENT_CLINIC_OR_DEPARTMENT_OTHER): Admitting: Oncology

## 2023-07-02 ENCOUNTER — Ambulatory Visit: Payer: 59

## 2023-07-02 ENCOUNTER — Telehealth: Payer: Self-pay

## 2023-07-02 ENCOUNTER — Inpatient Hospital Stay (HOSPITAL_BASED_OUTPATIENT_CLINIC_OR_DEPARTMENT_OTHER): Admitting: Hospice and Palliative Medicine

## 2023-07-02 ENCOUNTER — Ambulatory Visit
Admission: RE | Admit: 2023-07-02 | Discharge: 2023-07-02 | Disposition: A | Discharge status: Home / Self Care | Source: Ambulatory Visit | Attending: Radiation Oncology | Admitting: Radiation Oncology

## 2023-07-02 VITALS — BP 116/79 | HR 102 | Temp 98.3°F | Resp 16 | Wt 138.2 lb

## 2023-07-02 DIAGNOSIS — Z7189 Other specified counseling: Secondary | ICD-10-CM | POA: Diagnosis not present

## 2023-07-02 DIAGNOSIS — G893 Neoplasm related pain (acute) (chronic): Secondary | ICD-10-CM | POA: Diagnosis not present

## 2023-07-02 DIAGNOSIS — C159 Malignant neoplasm of esophagus, unspecified: Secondary | ICD-10-CM

## 2023-07-02 DIAGNOSIS — Z515 Encounter for palliative care: Secondary | ICD-10-CM

## 2023-07-02 DIAGNOSIS — Z510 Encounter for antineoplastic radiation therapy: Secondary | ICD-10-CM | POA: Diagnosis not present

## 2023-07-02 LAB — RAD ONC ARIA SESSION SUMMARY
Course Elapsed Days: 18
Plan Fractions Treated to Date: 4
Plan Prescribed Dose Per Fraction: 2 Gy
Plan Total Fractions Prescribed: 9
Plan Total Prescribed Dose: 18 Gy
Reference Point Dosage Given to Date: 20 Gy
Reference Point Session Dosage Given: 2 Gy
Session Number: 10

## 2023-07-02 MED ORDER — OXYCODONE HCL 5 MG PO TABS: 5.0000 mg | ORAL_TABLET | ORAL | 0 refills | Status: DC | PRN

## 2023-07-02 NOTE — Progress Notes (Signed)
 Patient denies new or acute problems/concerns today.

## 2023-07-02 NOTE — Telephone Encounter (Signed)
 Oxycodone PA submitted and approved via Cover My Meds (Key: [id number])

## 2023-07-02 NOTE — Progress Notes (Signed)
 Palliative Medicine [facility] at [facility] Telephone:([phone] [phone]   Name: Tyler Stanley Date: 07/02/2023 MRN: [id number]  DOB: January 18, 1981  Patient Care Team: [facility] as PCP - General    REASON FOR CONSULTATION: Tyler Stanley is a 43 y.o. 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  Patient status post PEG.  He is on treatment with systemic chemotherapy.  Was referred to palliative care to address goals and manage ongoing symptoms.  SOCIAL HISTORY:     reports that he has been smoking cigarettes. He has never used smokeless tobacco. He reports that he does not currently use alcohol. He reports that he does not currently use drugs after having used the following drugs: Marijuana.  Patient is unmarried.  Has no children.  His mother and father are living.  Patient has a sister who is his primary support person.  Has a brother from whom he is reportedly estranged.  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  ADVANCE DIRECTIVES:  Does not have  CODE STATUS:   PAST MEDICAL HISTORY: Past Medical History:  Diagnosis Date   ADHD     PAST SURGICAL HISTORY:  Past Surgical History:  Procedure Laterality Date   BIOPSY  06/06/2023   Procedure: BIOPSY;  Surgeon: Toney Reil, MD;  Location: [facility] ENDOSCOPY;  Service: Gastroenterology;;   ESOPHAGOGASTRODUODENOSCOPY (EGD) WITH PROPOFOL N/A 06/06/2023   Procedure: ESOPHAGOGASTRODUODENOSCOPY (EGD) WITH PROPOFOL;  Surgeon: Toney Reil, MD;  Location: [facility] ENDOSCOPY;  Service: Gastroenterology;  Laterality: N/A;   GASTROSTOMY N/A 06/07/2023   Procedure: INSERTION OF GASTROSTOMY TUBE;  Surgeon: Carolan Shiver, MD;  Location: [facility] ORS;  Service: General;  Laterality: N/A;   PORTACATH PLACEMENT Right 06/07/2023   Procedure: INSERTION PORT-A-CATH;  Surgeon: Carolan Shiver, MD;  Location: [facility]  ORS;  Service: General;  Laterality: Right;    HEMATOLOGY/ONCOLOGY HISTORY:  Oncology History  Esophageal cancer, stage IV (HCC)  06/10/2023 Initial Diagnosis   Esophageal cancer, stage IV (HCC)   06/30/2023 -  Chemotherapy   Patient is on Treatment Plan : GASTROESOPHAGEAL FOLFOX q14d x 6 cycles       ALLERGIES:  has no known allergies.  MEDICATIONS:  Current Outpatient Medications  Medication Sig Dispense Refill   acetaminophen (TYLENOL) 325 MG tablet Take 2 tablets (650 mg total) by mouth every 6 (six) hours as needed for mild pain (pain score 1-3) (or Fever >/= 101).     apixaban (ELIQUIS) 5 MG TABS tablet Take 1 tablet (5 mg total) by mouth 2 (two) times daily. 60 tablet 0   dexamethasone (DECADRON) 4 MG tablet Take 2 tablets (8 mg total) by mouth daily. Start the day after chemotherapy for 2 days. Take with food. 30 tablet 1   docusate (COLACE) 50 MG/5ML liquid Take 10 mLs (100 mg total) by mouth 2 (two) times daily. 100 mL 0   feeding supplement (ENSURE ENLIVE / ENSURE PLUS) LIQD Take 237 mLs by mouth 2 (two) times daily between meals. 237 mL 12   lidocaine-prilocaine (EMLA) cream Apply to affected area once 30 g 3   methocarbamol (ROBAXIN) 750 MG tablet Take 1 tablet (750 mg total) by mouth 3 (three) times daily. 90 tablet 0   nicotine (NICODERM CQ - DOSED IN MG/24 HOURS) 14 mg/24hr patch Place 1 patch (14 mg total) onto the skin daily. 28 patch 0   nicotine polacrilex (NICORETTE) 2 MG gum Take  1 each (2 mg total) by mouth as needed for smoking cessation. 100 tablet 0   Nutritional Supplements (FEEDING SUPPLEMENT, OSMOLITE 1.5 CAL,) LIQD Place 237 mLs into feeding tube 6 (six) times daily. 42660 mL 0   omeprazole (PRILOSEC) 20 MG capsule Take 20 mg by mouth daily.     ondansetron (ZOFRAN) 8 MG tablet Take 1 tablet (8 mg total) by mouth every 8 (eight) hours as needed for nausea or vomiting. Start on the third day after chemotherapy. 30 tablet 1   oxyCODONE (OXY IR/ROXICODONE) 5 MG  immediate release tablet Take 1 tablet (5 mg total) by mouth every 4 (four) hours as needed for moderate pain (pain score 4-6). 30 tablet 0   polyethylene glycol (MIRALAX / GLYCOLAX) 17 g packet Take 17 g by mouth daily. 14 each 0   pregabalin (LYRICA) 100 MG capsule Take 1 capsule (100 mg total) by mouth 3 (three) times daily. 90 capsule 0   prochlorperazine (COMPAZINE) 10 MG tablet Take 1 tablet (10 mg total) by mouth every 6 (six) hours as needed for nausea or vomiting. 30 tablet 1   sennosides (SENOKOT) 8.8 MG/5ML syrup Take 5 mLs by mouth at bedtime. 240 mL 0   Water For Irrigation, Sterile (FREE WATER) SOLN Place 100 mLs into feeding tube 6 (six) times daily. 18000 mL 0   No current facility-administered medications for this visit.    VITAL SIGNS: There were no vitals taken for this visit. There were no vitals filed for this visit.  Estimated body mass index is 20.39 kg/m as calculated from the following:   Height as of 06/07/23: 6' (1.829 m).   Weight as of 06/10/23: 150 lb 5.7 oz (68.2 kg).  LABS: CBC:    Component Value Date/Time   WBC 7.9 06/10/2023 0522   HGB 12.3 (L) 06/10/2023 0522   HGB 15.6 02/01/2019 1221   HCT 36.9 (L) 06/10/2023 0522   HCT 46.2 02/01/2019 1221   PLT 357 06/10/2023 0522   PLT 208 02/01/2019 1221   MCV 87.0 06/10/2023 0522   MCV 91 02/01/2019 1221   NEUTROABS 4.9 06/10/2023 0522   NEUTROABS 2.7 02/01/2019 1221   LYMPHSABS 1.8 06/10/2023 0522   LYMPHSABS 2.1 02/01/2019 1221   MONOABS 0.9 06/10/2023 0522   EOSABS 0.2 06/10/2023 0522   EOSABS 0.1 02/01/2019 1221   BASOSABS 0.1 06/10/2023 0522   BASOSABS 0.0 02/01/2019 1221   Comprehensive Metabolic Panel:    Component Value Date/Time   NA 131 (L) 06/10/2023 0522   NA 140 02/01/2019 1221   K 4.3 06/10/2023 0522   CL 96 (L) 06/10/2023 0522   CO2 26 06/10/2023 0522   BUN 24 (H) 06/10/2023 0522   BUN 13 02/01/2019 1221   CREATININE 0.81 06/10/2023 0522   GLUCOSE 93 06/10/2023 0522   CALCIUM  9.1 06/10/2023 0522   AST 20 06/04/2023 1220   ALT 14 06/04/2023 1220   ALKPHOS 104 06/04/2023 1220   BILITOT 0.3 06/04/2023 1220   BILITOT 0.4 02/01/2019 1221   PROT 6.9 06/04/2023 1220   PROT 6.9 02/01/2019 1221   ALBUMIN 3.0 (L) 06/04/2023 1220   ALBUMIN 4.4 02/01/2019 1221    RADIOGRAPHIC STUDIES: US Venous Img Upper Uni Right(DVT) Result Date: 06/22/2023 CLINICAL DATA:  Right arm pain. History recent left lower extremity DVT. EXAM: RIGHT UPPER EXTREMITY VENOUS DOPPLER ULTRASOUND TECHNIQUE: Gray-scale sonography with graded compression, as well as color Doppler and duplex ultrasound were performed to evaluate the upper extremity deep venous system  from the level of the subclavian vein and including the jugular, axillary, basilic, radial, ulnar and upper cephalic vein. Spectral Doppler was utilized to evaluate flow at rest and with distal augmentation maneuvers. COMPARISON:  None Available. FINDINGS: Contralateral Subclavian Vein: Respiratory phasicity is normal and symmetric with the symptomatic side. No evidence of thrombus. Normal compressibility. Internal Jugular Vein: There may be some mild nonocclusive thrombus adjacent to, or associated with an indwelling right jugular Port-A-Cath but this does not appear to be significant and is not flow limiting. Subclavian Vein: No evidence of thrombus. Normal compressibility, respiratory phasicity and response to augmentation. Axillary Vein: No evidence of thrombus. Normal compressibility, respiratory phasicity and response to augmentation. Cephalic Vein: Superficial thrombophlebitis of the right cephalic vein at the antecubital fossa and extending into the forearm. Basilic Vein: No evidence of thrombus. Normal compressibility, respiratory phasicity and response to augmentation. Brachial Veins: No evidence of thrombus. Normal compressibility, respiratory phasicity and response to augmentation. Radial Veins: No evidence of thrombus. Normal compressibility,  respiratory phasicity and response to augmentation. Ulnar Veins: No evidence of thrombus. Normal compressibility, respiratory phasicity and response to augmentation. Venous Reflux:  None visualized. Other Findings:  No abnormal fluid collections. IMPRESSION: 1. No evidence of right upper extremity deep venous thrombosis. 2. Superficial thrombophlebitis of the right cephalic vein at the antecubital fossa and extending into the forearm. 3. There may be some mild nonocclusive thrombus adjacent to, or associated with an indwelling right jugular Port-A-Cath but this does not appear to be significant and is not flow limiting. Electronically Signed   By: Irish Lack M.D.   On: 06/22/2023 10:46   DG Chest Port 1 View Result Date: 06/07/2023 CLINICAL DATA:  Port-A-Cath placement EXAM: PORTABLE CHEST 1 VIEW COMPARISON:  06/06/2023 FINDINGS: Single frontal view of the chest demonstrates right chest wall port via internal jugular approach, tip overlying the right atrium. The cardiac silhouette is unremarkable. No acute airspace disease, effusion, or pneumothorax. Mild background emphysema. There is free gas beneath the right hemidiaphragm, new since prior CT. No acute bony abnormalities. IMPRESSION: 1. New pneumoperitoneum beneath the right hemidiaphragm. According to the surgeon, a percutaneous gastrostomy tube was placed at the time of the chest wall port, accounting for the free gas. 2. Right chest wall port, tip overlying the right atrium. No evidence of pneumothorax. These results were called by telephone at the time of interpretation on 06/07/2023 at 4:17 pm to provider [facility] CINTRON-DIAZ , who verbally acknowledged these results. Electronically Signed   By: Sharlet Salina M.D.   On: 06/07/2023 16:23   DG C-Arm 1-60 Min-No Report Result Date: 06/07/2023 Fluoroscopy was utilized by the requesting physician.  No radiographic interpretation.   CT CHEST ABDOMEN PELVIS W CONTRAST Result Date: 06/06/2023 CLINICAL  DATA:  Deep venous thrombosis. Concern for metastatic disease. 43 year old male. Esophageal mass on endoscopy. * Tracking Code: BO * EXAM: CT CHEST, ABDOMEN, AND PELVIS WITH CONTRAST TECHNIQUE: Multidetector CT imaging of the chest, abdomen and pelvis was performed following the standard protocol during bolus administration of intravenous contrast. RADIATION DOSE REDUCTION: This exam was performed according to the departmental dose-optimization program which includes automated exposure control, adjustment of the mA and/or kV according to patient size and/or use of iterative reconstruction technique. CONTRAST:  80mL OMNIPAQUE IOHEXOL 300 MG/ML  SOLN COMPARISON:  None Available. FINDINGS: CT CHEST FINDINGS Cardiovascular: No significant vascular findings. Normal heart size. No pericardial effusion. No PE identified. Mediastinum/Nodes: Thickening through the distal esophagus leading up to the GE junction over  approximately 4 cm. Lymph node adjacent to the distal esophagus measures 10 mm on image 45/2. Subcarinal lymph node is also suspicious measuring 12 mm. Lungs/Pleura: No suspicious pulmonary nodules. Normal pleural. Airways normal. Musculoskeletal: No aggressive osseous lesion. CT ABDOMEN AND PELVIS FINDINGS Hepatobiliary: Multiple round hypoenhancing lesions within LEFT RIGHT hepatic lobe. Conglomerate cluster of lesions in the posterior RIGHT hepatic lobe measures 6.75.6 cm on image 58. A discrete lesion in the central LEFT hepatic lobe measures 17 mm on 55/2. There approximately 20 lesions in liver. Gallbladder normal. Pancreas: Pancreas is normal. No ductal dilatation. No pancreatic inflammation. Spleen: Normal spleen Adrenals/urinary tract: Adrenal glands and kidneys are normal. The ureters and bladder normal. Stomach/Bowel: Stomach, small bowel, appendix, and cecum are normal. The colon and rectosigmoid colon are normal. Vascular/Lymphatic: Abdominal aorta is normal caliber. Small nodule in the gastrohepatic  ligament and celiac nodal stations are suspicious given the hepatic metastasis. Reproductive: Prostate unremarkable Other: No peritoneal metastasis Musculoskeletal: No aggressive osseous lesion. 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 IMPRESSION: CHEST: 1. 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 2. Suspicious lymph nodes adjacent to the distal esophagus and in the subcarinal nodal station. 3. No pulmonary metastasis. PELVIS: 1. Multiple hypoenhancing lesions in the liver consistent with hepatic metastasis. 2. Probable metastatic adenopathy in the gastrohepatic ligament and celiac nodal stations. 3. No other evidence of metastatic disease in the abdomen pelvis. Electronically Signed   By: Genevive Bi M.D.   On: 06/06/2023 17:31   US Venous Img Lower Unilateral Left Result Date: 06/04/2023 CLINICAL DATA:  Several day history of left lower extremity pain, swelling, and warmth EXAM: LEFT LOWER EXTREMITY VENOUS DOPPLER ULTRASOUND TECHNIQUE: Gray-scale sonography with compression, as well as color and duplex ultrasound, were performed to evaluate the deep venous system(s) from the level of the common femoral vein through the popliteal and proximal calf veins. COMPARISON:  None Available. FINDINGS: VENOUS Echogenic thrombus is present within calf veins extending to the level of mid femoral vein. The calf veins, popliteal, and distal femoral veins are noncompressible. The proximal femoral vein is patent and compressible, however there is absence of normal respiratory plasticity and response to augmentation. Normal compressibility of the common femoral vein. Visualized portions of profunda femoral vein and great saphenous vein unremarkable. Limited views of the contralateral common femoral vein are unremarkable. OTHER None. Limitations: none IMPRESSION: Acute deep venous thrombosis of the left lower extremity extending from the calf veins to the level  of mid femoral vein. These results will be called to the ordering clinician or representative by the Radiologist Assistant, and communication documented in the PACS or [employer]. Electronically Signed   By: Agustin Cree M.D.   On: 06/04/2023 11:52    PERFORMANCE STATUS (ECOG) : 1 - Symptomatic but completely ambulatory  Review of Systems Unless otherwise noted, a complete review of systems is negative.  Physical Exam General: NAD Cardiovascular: regular rate and rhythm Pulmonary: clear ant fields Abdomen: soft, nontender, + bowel sounds GU: no suprapubic tenderness Extremities: no edema, no joint deformities Skin: no rashes Neurological: Weakness but otherwise nonfocal  IMPRESSION: Patient seen for posthospital follow-up.  Patient says that he recognizes that he has been diagnosed with a stage IV cancer and says that he has looked up the prognosis and understands that his life expectancy is limited.  He is seeking a second opinion at [facility] at the request of his mother.  He says that he is anxious about the option  of pursuing chemotherapy and wants to wait until the second opinion prior to making any further decisions about treatments.  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  His mother and father are alive and involved.  Patient says that his mother was going to be his primary source of support but that she has withdrawn.  Patient sister is now helping with his care.  Patient has a brother from whom he is estranged.  Patient's brother owns and operates an [employer] and patient feels that exposure to the noxious fumes may have been the cause of him developing cancer.  Symptomatically, patient has occasional abdominal pain.  He has been taking oxycodone intermittently with good improvement in symptoms.  However, patient says that he is currently out of the oxycodone and requests a refill.  Patient denies any adverse effects.  Patient sent home with a MOST form and ACP  documents.  He thinks he would want his sister to be his healthcare power of attorney.  Will send referral to SW to assist with ACP completion.  PLAN: -Patient awaiting second opinion at [facility] -Referrals to social work, nutrition, and Sports administrator Care -MOST Form/ACP documents reviewed -Refill oxycodone -PDMP reviewed -Daily bowel regimen -Follow-up 1 to 2 weeks  Case and plan discussed with Dr. Smith Robert   Patient expressed understanding and was in agreement with this plan. He also understands that He can call the clinic at any time with any questions, concerns, or complaints.     Time Total: 30 minutes  Visit consisted of counseling and education dealing with the complex and emotionally intense issues of symptom management and palliative care in the setting of serious and potentially life-threatening illness.Greater than 50%  of this time was spent counseling and coordinating care related to the above assessment and plan.  Signed by: Laurette Schimke, PhD, NP-C

## 2023-07-03 ENCOUNTER — Encounter: Payer: Self-pay | Admitting: Oncology

## 2023-07-03 ENCOUNTER — Other Ambulatory Visit: Payer: Self-pay

## 2023-07-03 NOTE — Progress Notes (Signed)
 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  Patient was seen by GI and underwent upper endoscopy which showed a large submucosal friable and ulcerating mass with bleeding and stigmata of recent bleeding in the distal esophagus and GE junction 40 cm from the incisors.  Adult endoscopy was not able to be traversed and ultraslim scope had to be used.  Given the complete obstruction patient had a palliative PEG tube placed and at the time of PEG tube placement also had liver biopsy  Biopsies from both esophageal mass and liver biopsy was consistent with adenocarcinoma.  HER2 equivocal by IHC and negative by FISH.  Tempus testing showed CPS score of 32.  PD-L1 30%.Claudin mutation negative.  No other actionable mutations.  CT chest abdomen pelvis with contrast showed multiple liver lesions consistent with liver metastases along with probable metastatic adenopathy in the gastrohepatic ligament and celiac nodal stations.  The patient was supposed to start chemotherapy in February 2025 but has postponed treatment due to not having a stable place to stay and is awaiting second opinion at [facility].  Meanwhile he has started palliative radiation to his esophageal  mass to facilitate eating in the future.  Interval history-patient is here with his father today.  He states that he is still trying to sort out his living situation and is not quite ready to start chemotherapy yet.  Pain is presently well-controlled with as needed oxycodone.  ECOG PS- 1 Pain scale- 0 Opioid associated constipation- no  Review of systems- Review of Systems  Constitutional:  Positive for malaise/fatigue. Negative for chills, fever and weight loss.  HENT:  Negative for congestion, ear discharge and nosebleeds.   Eyes:  Negative for blurred vision.  Respiratory:  Negative for cough, hemoptysis, sputum production, shortness of breath and wheezing.   Cardiovascular:  Negative for chest pain, palpitations, orthopnea and claudication.  Gastrointestinal:  Positive for abdominal pain. Negative for blood in stool, constipation, diarrhea, heartburn, melena, nausea and vomiting.  Genitourinary:  Negative for dysuria, flank pain, frequency, hematuria and urgency.  Musculoskeletal:  Negative for back pain, joint pain and myalgias.  Skin:  Negative for rash.  Neurological:  Negative for dizziness, tingling, focal weakness, seizures, weakness and headaches.  Endo/Heme/Allergies:  Does not bruise/bleed easily.  Psychiatric/Behavioral:  Negative for depression and suicidal ideas. The patient does not have insomnia.       No Known Allergies   Past Medical History:  Diagnosis Date   ADHD      Past Surgical History:  Procedure Laterality Date   BIOPSY  06/06/2023   Procedure: BIOPSY;  Surgeon: Toney Reil, MD;  Location: [facility] ENDOSCOPY;  Service: Gastroenterology;;   ESOPHAGOGASTRODUODENOSCOPY (EGD) WITH PROPOFOL N/A 06/06/2023   Procedure: ESOPHAGOGASTRODUODENOSCOPY (EGD) WITH PROPOFOL;  Surgeon: Toney Reil, MD;  Location: [facility] ENDOSCOPY;  Service: Gastroenterology;  Laterality: N/A;   GASTROSTOMY N/A 06/07/2023   Procedure: INSERTION OF GASTROSTOMY TUBE;  Surgeon:  Carolan Shiver, MD;  Location: [facility] ORS;  Service: General;  Laterality: N/A;   PORTACATH PLACEMENT Right 06/07/2023   Procedure: INSERTION PORT-A-CATH;  Surgeon: Carolan Shiver, MD;  Location: [facility] ORS;  Service: General;  Laterality: Right;    Social History   Socioeconomic History   Marital status: Single    Spouse name: Not on file   Number of children: Not on file   Years of education: Not on file   Highest education level: Not on file  Occupational History   Not on file  Tobacco Use   Smoking status: Every Day    Current packs/day: 0.50    Types: Cigarettes   Smokeless tobacco: Never  Vaping Use   Vaping status: Never Used  Substance and Sexual Activity   Alcohol use: Not Currently   Drug use: Not Currently    Types: Marijuana   Sexual activity: Yes  Other Topics Concern   Not on file  Social History Narrative   Not on file   Social Drivers of Health   Financial Resource Strain: High Risk (07/01/2023)   Received from [facility]   Overall Financial Resource Strain (CARDIA)    Difficulty of Paying Living Expenses: Very hard  Food Insecurity: No Food Insecurity (07/01/2023)   Received from [facility]   Hunger Vital Sign    Worried About Running Out of Food in the Last Year: Never true    Ran Out of Food in the Last Year: Never true  Transportation Needs: No Transportation Needs (07/01/2023)   Received from [facility] - Transportation    Lack of Transportation (Medical): No    Lack of Transportation (Non-Medical): No  Physical Activity: Not on file  Stress: Not on file  Social Connections: Not on file  Intimate Partner Violence: Not At Risk (06/04/2023)   Humiliation, Afraid, Rape, and Kick questionnaire    Fear of Current or Ex-Partner: No    Emotionally Abused: No    Physically Abused: No    Sexually Abused: No    No family history on file.   Current Outpatient Medications:    acetaminophen (TYLENOL) 325 MG tablet,  Take 2 tablets (650 mg total) by mouth every 6 (six) hours as needed for mild pain (pain score 1-3) (or Fever >/= 101)., Disp: , Rfl:    apixaban (ELIQUIS) 5 MG TABS tablet, Take 1 tablet (5 mg total) by mouth 2 (two) times daily., Disp: 60 tablet, Rfl: 0   dexamethasone (DECADRON) 4 MG tablet, Take 2 tablets (8 mg total) by mouth daily. Start the day after chemotherapy for 2 days. Take with food., Disp: 30 tablet, Rfl: 1   docusate (COLACE) 50 MG/5ML liquid, Take 10 mLs (100 mg total) by mouth 2 (two) times daily., Disp: 100 mL, Rfl: 0   feeding supplement (ENSURE ENLIVE / ENSURE PLUS) LIQD, Take 237 mLs by mouth 2 (two) times daily between meals., Disp: 237 mL, Rfl: 12   lidocaine-prilocaine (EMLA) cream, Apply to affected area once, Disp: 30 g, Rfl: 3   methocarbamol (ROBAXIN) 750 MG tablet, Take 1 tablet (750 mg total) by mouth 3 (three) times daily. (Patient not taking: Reported on 07/02/2023), Disp: 90 tablet, Rfl: 0   nicotine (NICODERM CQ - DOSED IN MG/24 HOURS) 14 mg/24hr patch, Place 1 patch (14 mg total) onto the skin daily., Disp: 28 patch,  Rfl: 0   nicotine polacrilex (NICORETTE) 2 MG gum, Take 1 each (2 mg total) by mouth as needed for smoking cessation. (Patient not taking: Reported on 07/02/2023), Disp: 100 tablet, Rfl: 0   Nutritional Supplements (FEEDING SUPPLEMENT, OSMOLITE 1.5 CAL,) LIQD, Place 237 mLs into feeding tube 6 (six) times daily., Disp: 42660 mL, Rfl: 0   omeprazole (PRILOSEC) 20 MG capsule, Take 20 mg by mouth daily., Disp: , Rfl:    ondansetron (ZOFRAN) 8 MG tablet, Take 1 tablet (8 mg total) by mouth every 8 (eight) hours as needed for nausea or vomiting. Start on the third day after chemotherapy., Disp: 30 tablet, Rfl: 1   oxyCODONE (OXY IR/ROXICODONE) 5 MG immediate release tablet, Take 1 tablet (5 mg total) by mouth every 4 (four) hours as needed for moderate pain (pain score 4-6)., Disp: 30 tablet, Rfl: 0   polyethylene glycol (MIRALAX / GLYCOLAX) 17 g packet, Take 17  g by mouth daily., Disp: 14 each, Rfl: 0   pregabalin (LYRICA) 100 MG capsule, Take 1 capsule (100 mg total) by mouth 3 (three) times daily. (Patient not taking: Reported on 07/02/2023), Disp: 90 capsule, Rfl: 0   prochlorperazine (COMPAZINE) 10 MG tablet, Take 1 tablet (10 mg total) by mouth every 6 (six) hours as needed for nausea or vomiting., Disp: 30 tablet, Rfl: 1   sennosides (SENOKOT) 8.8 MG/5ML syrup, Take 5 mLs by mouth at bedtime., Disp: 240 mL, Rfl: 0   Water For Irrigation, Sterile (FREE WATER) SOLN, Place 100 mLs into feeding tube 6 (six) times daily., Disp: 18000 mL, Rfl: 0  Physical exam: There were no vitals filed for this visit. Physical Exam Cardiovascular:     Rate and Rhythm: Normal rate and regular rhythm.     Heart sounds: Normal heart sounds.  Pulmonary:     Effort: Pulmonary effort is normal.     Breath sounds: Normal breath sounds.  Abdominal:     General: Bowel sounds are normal.     Palpations: Abdomen is soft.     Comments: Peg tube in place  Skin:    General: Skin is warm and dry.  Neurological:     Mental Status: He is alert and oriented to person, place, and time.         Latest Ref Rng & Units 06/10/2023    5:22 AM  CMP  Glucose 70 - 99 mg/dL 93   BUN 6 - 20 mg/dL 24   Creatinine 4.13 - 1.24 mg/dL 2.44   Sodium 010 - 272 mmol/L 131   Potassium 3.5 - 5.1 mmol/L 4.3   Chloride 98 - 111 mmol/L 96   CO2 22 - 32 mmol/L 26   Calcium 8.9 - 10.3 mg/dL 9.1       Latest Ref Rng & Units 06/10/2023    5:22 AM  CBC  WBC 4.0 - 10.5 K/uL 7.9   Hemoglobin 13.0 - 17.0 g/dL 53.6   Hematocrit 64.4 - 52.0 % 36.9   Platelets 150 - 400 K/uL 357     No images are attached to the encounter.  US Venous Img Upper Uni Right(DVT) Result Date: 06/22/2023 CLINICAL DATA:  Right arm pain. History recent left lower extremity DVT. EXAM: RIGHT UPPER EXTREMITY VENOUS DOPPLER ULTRASOUND TECHNIQUE: Gray-scale sonography with graded compression, as well as color Doppler and  duplex ultrasound were performed to evaluate the upper extremity deep venous system from the level of the subclavian vein and including the jugular, axillary, basilic, radial, ulnar and  upper cephalic vein. Spectral Doppler was utilized to evaluate flow at rest and with distal augmentation maneuvers. COMPARISON:  None Available. FINDINGS: Contralateral Subclavian Vein: Respiratory phasicity is normal and symmetric with the symptomatic side. No evidence of thrombus. Normal compressibility. Internal Jugular Vein: There may be some mild nonocclusive thrombus adjacent to, or associated with an indwelling right jugular Port-A-Cath but this does not appear to be significant and is not flow limiting. Subclavian Vein: No evidence of thrombus. Normal compressibility, respiratory phasicity and response to augmentation. Axillary Vein: No evidence of thrombus. Normal compressibility, respiratory phasicity and response to augmentation. Cephalic Vein: Superficial thrombophlebitis of the right cephalic vein at the antecubital fossa and extending into the forearm. Basilic Vein: No evidence of thrombus. Normal compressibility, respiratory phasicity and response to augmentation. Brachial Veins: No evidence of thrombus. Normal compressibility, respiratory phasicity and response to augmentation. Radial Veins: No evidence of thrombus. Normal compressibility, respiratory phasicity and response to augmentation. Ulnar Veins: No evidence of thrombus. Normal compressibility, respiratory phasicity and response to augmentation. Venous Reflux:  None visualized. Other Findings:  No abnormal fluid collections. IMPRESSION: 1. No evidence of right upper extremity deep venous thrombosis. 2. Superficial thrombophlebitis of the right cephalic vein at the antecubital fossa and extending into the forearm. 3. There may be some mild nonocclusive thrombus adjacent to, or associated with an indwelling right jugular Port-A-Cath but this does not appear to be  significant and is not flow limiting. Electronically Signed   By: Irish Lack M.D.   On: 06/22/2023 10:46   DG Chest Port 1 View Result Date: 06/07/2023 CLINICAL DATA:  Port-A-Cath placement EXAM: PORTABLE CHEST 1 VIEW COMPARISON:  06/06/2023 FINDINGS: Single frontal view of the chest demonstrates right chest wall port via internal jugular approach, tip overlying the right atrium. The cardiac silhouette is unremarkable. No acute airspace disease, effusion, or pneumothorax. Mild background emphysema. There is free gas beneath the right hemidiaphragm, new since prior CT. No acute bony abnormalities. IMPRESSION: 1. New pneumoperitoneum beneath the right hemidiaphragm. According to the surgeon, a percutaneous gastrostomy tube was placed at the time of the chest wall port, accounting for the free gas. 2. Right chest wall port, tip overlying the right atrium. No evidence of pneumothorax. These results were called by telephone at the time of interpretation on 06/07/2023 at 4:17 pm to provider [facility] CINTRON-DIAZ , who verbally acknowledged these results. Electronically Signed   By: Sharlet Salina M.D.   On: 06/07/2023 16:23   DG C-Arm 1-60 Min-No Report Result Date: 06/07/2023 Fluoroscopy was utilized by the requesting physician.  No radiographic interpretation.   CT CHEST ABDOMEN PELVIS W CONTRAST Result Date: 06/06/2023 CLINICAL DATA:  Deep venous thrombosis. Concern for metastatic disease. 43 year old male. Esophageal mass on endoscopy. * Tracking Code: BO * EXAM: CT CHEST, ABDOMEN, AND PELVIS WITH CONTRAST TECHNIQUE: Multidetector CT imaging of the chest, abdomen and pelvis was performed following the standard protocol during bolus administration of intravenous contrast. RADIATION DOSE REDUCTION: This exam was performed according to the departmental dose-optimization program which includes automated exposure control, adjustment of the mA and/or kV according to patient size and/or use of iterative  reconstruction technique. CONTRAST:  80mL OMNIPAQUE IOHEXOL 300 MG/ML  SOLN COMPARISON:  None Available. FINDINGS: CT CHEST FINDINGS Cardiovascular: No significant vascular findings. Normal heart size. No pericardial effusion. No PE identified. Mediastinum/Nodes: Thickening through the distal esophagus leading up to the GE junction over approximately 4 cm. Lymph node adjacent to the distal esophagus measures 10 mm on image 45/2.  Subcarinal lymph node is also suspicious measuring 12 mm. Lungs/Pleura: No suspicious pulmonary nodules. Normal pleural. Airways normal. Musculoskeletal: No aggressive osseous lesion. CT ABDOMEN AND PELVIS FINDINGS Hepatobiliary: Multiple round hypoenhancing lesions within LEFT RIGHT hepatic lobe. Conglomerate cluster of lesions in the posterior RIGHT hepatic lobe measures 6.75.6 cm on image 58. A discrete lesion in the central LEFT hepatic lobe measures 17 mm on 55/2. There approximately 20 lesions in liver. Gallbladder normal. Pancreas: Pancreas is normal. No ductal dilatation. No pancreatic inflammation. Spleen: Normal spleen Adrenals/urinary tract: Adrenal glands and kidneys are normal. The ureters and bladder normal. Stomach/Bowel: Stomach, small bowel, appendix, and cecum are normal. The colon and rectosigmoid colon are normal. Vascular/Lymphatic: Abdominal aorta is normal caliber. Small nodule in the gastrohepatic ligament and celiac nodal stations are suspicious given the hepatic metastasis. Reproductive: Prostate unremarkable Other: No peritoneal metastasis Musculoskeletal: No aggressive osseous lesion. 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 IMPRESSION: CHEST: 1. 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 2. Suspicious lymph nodes adjacent to the distal esophagus and in the subcarinal nodal station. 3. No pulmonary metastasis. PELVIS: 1. Multiple hypoenhancing lesions in the liver consistent with hepatic metastasis. 2.  Probable metastatic adenopathy in the gastrohepatic ligament and celiac nodal stations. 3. No other evidence of metastatic disease in the abdomen pelvis. Electronically Signed   By: Genevive Bi M.D.   On: 06/06/2023 17:31   US Venous Img Lower Unilateral Left Result Date: 06/04/2023 CLINICAL DATA:  Several day history of left lower extremity pain, swelling, and warmth EXAM: LEFT LOWER EXTREMITY VENOUS DOPPLER ULTRASOUND TECHNIQUE: Gray-scale sonography with compression, as well as color and duplex ultrasound, were performed to evaluate the deep venous system(s) from the level of the common femoral vein through the popliteal and proximal calf veins. COMPARISON:  None Available. FINDINGS: VENOUS Echogenic thrombus is present within calf veins extending to the level of mid femoral vein. The calf veins, popliteal, and distal femoral veins are noncompressible. The proximal femoral vein is patent and compressible, however there is absence of normal respiratory plasticity and response to augmentation. Normal compressibility of the common femoral vein. Visualized portions of profunda femoral vein and great saphenous vein unremarkable. Limited views of the contralateral common femoral vein are unremarkable. OTHER None. Limitations: none IMPRESSION: Acute deep venous thrombosis of the left lower extremity extending from the calf veins to the level of mid femoral vein. These results will be called to the ordering clinician or representative by the Radiologist Assistant, and communication documented in the PACS or [employer]. Electronically Signed   By: Agustin Cree M.D.   On: 06/04/2023 11:52     Assessment and plan- Patient is a 43 y.o. male static esophageal adenocarcinoma with liver metastases here to discuss further management  My plan was to get started with FOLFOX chemotherapy while we await HER2 and Tempus testing results.  However patient was concerned about effect of chemotherapy on his fertility.  He  is never married and has not had any children so far but was interested in having children and initially wanted to hold off on chemotherapy until he met with fertility counseling.  He subsequently dropped that idea but has still not started chemotherapy as he is trying to sort out his social situation as to where he can live and be cared for while undergoing treatment.  He is however undergoing palliative radiation for his primary esophageal mass to enable him to eat in the future.  He does have  a PEG tube in place.  HER2 FISH testing results were negative.  Tempus testing showed PD-L1 of 30% which translates into a CPS score of 32.  Claudin 18 testing negative on Tempus and no other actionable mutations.  I would therefore recommend palliative chemotherapy with FOLFOX plus Keytruda.  Rande Lawman would be given every 3 weeks and FOLFOX would be given every 2 weeks until progression or toxicity.  Plan to obtain baseline CEA testing prior to starting chemotherapy.  However patient is hesitant to start chemotherapy at this time.  He is awaiting a PET scan at [facility] in 1 week followed by a second opinion consult there and based on that he will decide which way to go.  We will therefore not start any treatment at this time and await patient's decision.   Cancer Staging  Esophageal cancer, stage IV (HCC) Staging form: Esophagus - Adenocarcinoma, AJCC 8th Edition - Clinical stage from 06/16/2023: Stage IVB (cT3, cN2, pM1, G3) - Signed by Creig Hines, MD on 07/03/2023 Histologic grading system: 3 grade system     Visit Diagnosis 1. Goals of care, counseling/discussion   2. Esophageal cancer, stage IV (HCC)      Dr. Owens Shark, MD, MPH [facility] [phone] 07/03/2023 1:17 PM

## 2023-07-05 ENCOUNTER — Ambulatory Visit: Payer: 59

## 2023-07-05 ENCOUNTER — Other Ambulatory Visit: Payer: Self-pay

## 2023-07-05 ENCOUNTER — Ambulatory Visit
Admission: RE | Admit: 2023-07-05 | Discharge: 2023-07-05 | Disposition: A | Discharge status: Home / Self Care | Source: Ambulatory Visit | Attending: Radiation Oncology | Admitting: Radiation Oncology

## 2023-07-05 ENCOUNTER — Ambulatory Visit

## 2023-07-05 ENCOUNTER — Inpatient Hospital Stay

## 2023-07-05 DIAGNOSIS — Z510 Encounter for antineoplastic radiation therapy: Secondary | ICD-10-CM | POA: Diagnosis not present

## 2023-07-05 LAB — RAD ONC ARIA SESSION SUMMARY
Course Elapsed Days: 21
Plan Fractions Treated to Date: 5
Plan Prescribed Dose Per Fraction: 2 Gy
Plan Total Fractions Prescribed: 9
Plan Total Prescribed Dose: 18 Gy
Reference Point Dosage Given to Date: 22 Gy
Reference Point Session Dosage Given: 2 Gy
Session Number: 11

## 2023-07-05 NOTE — Progress Notes (Signed)
 [facility] Clinical Social Work  Clinical Social Work was referred by medical provider for assessment of psychosocial needs.  Clinical Social Worker contacted patient by phone to offer support and assess for needs.    Patient was unable to participate in a conversation at this time.  Phone meeting scheduled with patient at 11am tomorrow which will be more convenient for him.     Dorothey Baseman, LCSW  Clinical Social Worker [facility]

## 2023-07-06 ENCOUNTER — Ambulatory Visit: Payer: 59

## 2023-07-06 ENCOUNTER — Inpatient Hospital Stay

## 2023-07-06 ENCOUNTER — Telehealth: Payer: Self-pay

## 2023-07-06 ENCOUNTER — Ambulatory Visit
Admission: RE | Admit: 2023-07-06 | Discharge: 2023-07-06 | Disposition: A | Discharge status: Home / Self Care | Source: Ambulatory Visit | Attending: Radiation Oncology | Admitting: Radiation Oncology

## 2023-07-06 ENCOUNTER — Other Ambulatory Visit: Payer: Self-pay

## 2023-07-06 DIAGNOSIS — Z510 Encounter for antineoplastic radiation therapy: Secondary | ICD-10-CM | POA: Diagnosis not present

## 2023-07-06 LAB — RAD ONC ARIA SESSION SUMMARY
Course Elapsed Days: 22
Plan Fractions Treated to Date: 6
Plan Prescribed Dose Per Fraction: 2 Gy
Plan Total Fractions Prescribed: 9
Plan Total Prescribed Dose: 18 Gy
Reference Point Dosage Given to Date: 24 Gy
Reference Point Session Dosage Given: 2 Gy
Session Number: 12

## 2023-07-06 NOTE — Progress Notes (Signed)
 [facility] Clinical Social Work  Clinical Social Work was referred by medical provider for assessment of psychosocial needs.  Clinical Social Worker contacted patient by phone to offer support and assess for needs.    Patient stated he was interested in applying for disability.  Reviewed the requirements and process and he agreed.  CSW to send referral to the [facility] to initiate.  He was also interested in obtaining food stamps.  CSW securely emailed patient contact information for [facility].  He expressed no other needs at this time.     Dorothey Baseman, LCSW  Clinical Social Worker [facility]

## 2023-07-06 NOTE — Telephone Encounter (Signed)
 Clinical Social Work attempted to contact patient by phone per our agreed upon time.  Left voicemail with contact information and request for return call.

## 2023-07-07 ENCOUNTER — Ambulatory Visit
Admission: RE | Admit: 2023-07-07 | Discharge: 2023-07-07 | Disposition: A | Discharge status: Home / Self Care | Source: Ambulatory Visit | Attending: Radiation Oncology | Admitting: Radiation Oncology

## 2023-07-07 ENCOUNTER — Ambulatory Visit: Payer: 59

## 2023-07-07 ENCOUNTER — Other Ambulatory Visit: Payer: Self-pay

## 2023-07-07 DIAGNOSIS — Z510 Encounter for antineoplastic radiation therapy: Secondary | ICD-10-CM | POA: Diagnosis not present

## 2023-07-07 LAB — RAD ONC ARIA SESSION SUMMARY
Course Elapsed Days: 23
Plan Fractions Treated to Date: 7
Plan Prescribed Dose Per Fraction: 2 Gy
Plan Total Fractions Prescribed: 9
Plan Total Prescribed Dose: 18 Gy
Reference Point Dosage Given to Date: 26 Gy
Reference Point Session Dosage Given: 2 Gy
Session Number: 13

## 2023-07-08 ENCOUNTER — Other Ambulatory Visit: Payer: Self-pay

## 2023-07-08 ENCOUNTER — Ambulatory Visit: Payer: 59

## 2023-07-08 ENCOUNTER — Ambulatory Visit
Admission: RE | Admit: 2023-07-08 | Discharge: 2023-07-08 | Disposition: A | Discharge status: Home / Self Care | Source: Ambulatory Visit | Attending: Radiation Oncology | Admitting: Radiation Oncology

## 2023-07-08 ENCOUNTER — Ambulatory Visit

## 2023-07-08 DIAGNOSIS — Z510 Encounter for antineoplastic radiation therapy: Secondary | ICD-10-CM | POA: Diagnosis not present

## 2023-07-08 LAB — RAD ONC ARIA SESSION SUMMARY
Course Elapsed Days: 24
Plan Fractions Treated to Date: 8
Plan Prescribed Dose Per Fraction: 2 Gy
Plan Total Fractions Prescribed: 9
Plan Total Prescribed Dose: 18 Gy
Reference Point Dosage Given to Date: 28 Gy
Reference Point Session Dosage Given: 2 Gy
Session Number: 14

## 2023-07-09 ENCOUNTER — Ambulatory Visit: Payer: 59

## 2023-07-09 ENCOUNTER — Ambulatory Visit

## 2023-07-12 ENCOUNTER — Ambulatory Visit
Admission: RE | Admit: 2023-07-12 | Discharge: 2023-07-12 | Disposition: A | Discharge status: Home / Self Care | Source: Ambulatory Visit | Attending: Radiation Oncology | Admitting: Radiation Oncology

## 2023-07-12 ENCOUNTER — Other Ambulatory Visit: Payer: Self-pay

## 2023-07-12 DIAGNOSIS — Z510 Encounter for antineoplastic radiation therapy: Secondary | ICD-10-CM | POA: Diagnosis not present

## 2023-07-12 LAB — RAD ONC ARIA SESSION SUMMARY
Course Elapsed Days: 28
Plan Fractions Treated to Date: 9
Plan Prescribed Dose Per Fraction: 2 Gy
Plan Total Fractions Prescribed: 9
Plan Total Prescribed Dose: 18 Gy
Reference Point Dosage Given to Date: 30 Gy
Reference Point Session Dosage Given: 2 Gy
Session Number: 15

## 2023-07-13 NOTE — Radiation Completion Notes (Signed)
 Patient Name: Tyler Stanley, Tyler Stanley MRN: [id number] Date of Birth: 08/06/1980 Referring Physician: Charlsie [facility], M.D. Date of Service: 2023-07-13 Radiation Oncologist: Carmina Miller, M.D. [facility]                             RADIATION ONCOLOGY END OF TREATMENT NOTE     Diagnosis: C15.5 Malignant neoplasm of lower third of esophagus Staging on 2023-06-16: Esophageal cancer, stage IV (HCC) T=cT3, N=cN2, M=pM1 Intent: Curative     HPI: Patient is a 43 year old male who presented to the ER with left calf pain and swelling found to have a DVT on Doppler.  He was noted to have significant progressive dysphagia and was seen by G and underwent upper endoscopy showing large submucosal friable ulcerating mass with bleeding in the distal esophagus and GE junction 40 cm from the incisors.  He has been biopsied although pathology is pending.  CT scan of chest abdomen pelvis showed thickening of the distal esophagus consistent with primary esophageal adenocarcinoma.  He also has suspicious lymph nodes adjacent to the distal esophagus and in the subcarinal region.  He also was noted to have multiple hypoenhancing lesions in the liver consistent with metastatic disease making him stage IV.  He also had probable metastatic adenopathy in the gastrohepatic ligament and celiac nodal stations.  Patient has had a PEG tube placed.  He is seen today for consideration of palliative radiation therapy to his esophagus.  He is weak seen in his hospital room.      ==========DELIVERED PLANS==========  First Treatment Date: 2023-06-14 Last Treatment Date: 2023-07-12   Plan Name: Esoph_3D Site: Esophagus Technique: 3D Mode: Photon Dose Per Fraction: 2 Gy Prescribed Dose (Delivered / Prescribed): 12 Gy / 12 Gy Prescribed Fxs (Delivered / Prescribed): 6 / 6   Plan Name: Esoph_replan Site: Esophagus Technique: 3D Mode: Photon Dose Per Fraction: 2 Gy Prescribed Dose (Delivered /  Prescribed): 18 Gy / 18 Gy Prescribed Fxs (Delivered / Prescribed): 9 / 9     ==========ON TREATMENT VISIT DATES========== 2023-06-15, 2023-06-22, 2023-06-29, 2023-07-06, 2023-07-12     ==========UPCOMING VISITS==========       ==========APPENDIX - ON TREATMENT VISIT NOTES==========   See weekly On Treatment Notes in Epic for details in the Media tab (listed as Progress notes on the On Treatment Visit Dates listed above).

## 2023-07-14 ENCOUNTER — Encounter: Payer: Self-pay | Admitting: Oncology

## 2023-07-14 ENCOUNTER — Telehealth: Payer: Self-pay

## 2023-07-14 NOTE — Telephone Encounter (Signed)
 Attempted to call Tyler Stanley to ensure that he will be receiving his chemo at [facility]. Phone number no longer connects. Unable to reach.

## 2023-07-15 ENCOUNTER — Encounter: Payer: Self-pay | Admitting: Oncology

## 2023-07-15 ENCOUNTER — Other Ambulatory Visit: Payer: Self-pay | Admitting: Hospice and Palliative Medicine

## 2023-07-15 ENCOUNTER — Encounter: Payer: Self-pay | Admitting: Hospice and Palliative Medicine

## 2023-07-15 NOTE — Telephone Encounter (Signed)
 Dr. Vaishnavi Dalby Robert states that pt may have transferred care to [facility] and they should refill medications.

## 2023-07-15 NOTE — Telephone Encounter (Signed)
 Lauren, I sent the MyChart message regarding refill on blood thinner to Dr. Dalessandro Baldyga Robert this morning.  We did not send last rx

## 2023-07-15 NOTE — Telephone Encounter (Signed)
 Dr. Shelby Anderle Robert, the Eliquis was last prescribed by Jonah Blue, MD.  Do you want to refill?

## 2023-07-19 ENCOUNTER — Encounter: Payer: Self-pay | Admitting: Oncology

## 2023-07-21 ENCOUNTER — Ambulatory Visit: Admitting: Radiation Oncology

## 2023-08-02 ENCOUNTER — Ambulatory Visit: Admitting: Radiation Oncology

## 2023-08-05 ENCOUNTER — Ambulatory Visit: Attending: Radiation Oncology | Admitting: Radiation Oncology

## 2023-08-11 DIAGNOSIS — F17200 Nicotine dependence, unspecified, uncomplicated: Secondary | ICD-10-CM | POA: Diagnosis not present

## 2023-08-11 DIAGNOSIS — C159 Malignant neoplasm of esophagus, unspecified: Secondary | ICD-10-CM | POA: Diagnosis not present

## 2023-08-23 ENCOUNTER — Encounter: Payer: Self-pay | Admitting: Oncology

## 2023-08-25 ENCOUNTER — Telehealth: Payer: Self-pay

## 2023-08-25 NOTE — Telephone Encounter (Signed)
 [facility] calling to request orders for [facility] home infusion.    Informed [facility] representative that patient not seen by Dr. Randy Buttery since 06/2023 with no future appts and currently receiving treatment at [facility].  Advise they contact [facility] for new orders.

## 2023-09-07 ENCOUNTER — Encounter: Payer: Self-pay | Admitting: Oncology

## 2023-09-07 NOTE — Progress Notes (Signed)
 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 Tyler Stanley is not interested in psychiatric medication recommendations at this time. Assessments: Tyler Stanley scored PHQ-9= 2 (minimal), reporting some fatigue and depressed mood. Tyler Stanley attributed the latter to his disease and prognosis. Tyler Stanley scored GAD-7= 2 (minimal), reporting some restlessness and trouble relaxing. Smoking Cessation: Tyler Stanley reported being on day 4 or 5 without smoking cigarettes. He is currently admitted at the hospital for a blood clot and this has been supportive to cessation. 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 Tyler Stanley lives alone with his Civil engineer, contracting, surrounded by 16 family members in close proximity.

## 2023-09-08 ENCOUNTER — Encounter: Payer: Self-pay | Admitting: *Deleted

## 2023-09-08 ENCOUNTER — Other Ambulatory Visit: Payer: Self-pay | Admitting: *Deleted

## 2023-09-08 DIAGNOSIS — F17200 Nicotine dependence, unspecified, uncomplicated: Secondary | ICD-10-CM | POA: Insufficient documentation

## 2023-09-08 DIAGNOSIS — F4320 Adjustment disorder, unspecified: Secondary | ICD-10-CM | POA: Insufficient documentation

## 2023-09-09 ENCOUNTER — Encounter: Payer: Self-pay | Admitting: Oncology

## 2023-09-09 NOTE — Progress Notes (Signed)
 Patient enrolled in [facility] services on 07/05/2023 and intake occurred on 07/23/2023.

## 2023-09-11 DIAGNOSIS — F4320 Adjustment disorder, unspecified: Secondary | ICD-10-CM | POA: Diagnosis not present

## 2023-09-11 DIAGNOSIS — C159 Malignant neoplasm of esophagus, unspecified: Secondary | ICD-10-CM | POA: Diagnosis not present

## 2023-09-11 DIAGNOSIS — F17200 Nicotine dependence, unspecified, uncomplicated: Secondary | ICD-10-CM | POA: Diagnosis not present

## 2023-09-13 ENCOUNTER — Encounter: Payer: Self-pay | Admitting: Oncology

## 2023-10-12 ENCOUNTER — Encounter: Payer: Self-pay | Admitting: Oncology

## 2023-10-12 NOTE — Progress Notes (Signed)
 RO - Discharge Date of Service: Oct 11 2023 Time of Service: 2:40 PM Discharge Plan Member has been discharged from [facility]. Please see below Discharge Plan for details. The Member has been provided a copy of their Discharge Plan via the [facility] portal. If you have any questions, please contact our Clinical Team at [phone].  Name Tyler Stanley Date of Birth Jan 07, 1981 Discharged Reason No longer eligible: Left Contracted Provider Panel Discharging to: Referring Specialist Referring Provider: Fonda Mower Psychiatric Medication Recommendation Transition N/A (no [facility] med recs made)  Referrals: Member referred to [facility] virtual smoking cessation program 09/08/23 by Sidra Mower NP and LCSW Tinnie Ro.  Discharge Summary Care Delivered: Member attended the intake visit, but not the subsequent visit. Per chart review, Member transitioned oncolgy care to [facility], and is no longer [facility] patient. Current status: Reason for discharge: Left contracted provider panel Additional details: Member referred to virtual smoking cessation program at [facility], if interested Should referring organization contact?: Unknown if Member is in need of /interested in additional support [facility] remains available should patient return to [facility] and express interest in the future.  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 Tyler Stanley is not interested in psychiatric medication recommendations at this time. Assessments: Tyler Stanley scored PHQ-9= 2 (minimal), reporting some fatigue and depressed mood. Tyler Stanley attributed the latter to his disease and prognosis. Tyler Stanley scored GAD-7= 2 (minimal), reporting some restlessness and trouble relaxing. Smoking Cessation: Tyler Stanley reported being on day 4 or 5 without smoking cigarettes. He is currently admitted at the hospital for a  blood clot and this has been supportive to cessation. 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 Tyler Stanley lives alone with his Civil engineer, contracting, surrounded by 16 family members in close proximity.  [facility] Provider: Jorene Arloa Specking, [facility] Health Care Manager

## 2023-10-12 DEATH — deceased

## 2023-11-08 ENCOUNTER — Encounter: Payer: Self-pay | Admitting: Oncology
